# Patient Record
Sex: Female | Born: 1975
Health system: Southern US, Community
[De-identification: ages and names within clinical notes are randomized; demographics above are authoritative.]

## PROBLEM LIST (undated history)

## (undated) DIAGNOSIS — R51 Headache: Secondary | ICD-10-CM

## (undated) DIAGNOSIS — I10 Essential (primary) hypertension: Secondary | ICD-10-CM

## (undated) DIAGNOSIS — E079 Disorder of thyroid, unspecified: Secondary | ICD-10-CM

## (undated) DIAGNOSIS — Z8619 Personal history of other infectious and parasitic diseases: Secondary | ICD-10-CM

## (undated) DIAGNOSIS — J189 Pneumonia, unspecified organism: Secondary | ICD-10-CM

## (undated) DIAGNOSIS — R519 Headache, unspecified: Secondary | ICD-10-CM

## (undated) DIAGNOSIS — R0600 Dyspnea, unspecified: Secondary | ICD-10-CM

## (undated) DIAGNOSIS — F419 Anxiety disorder, unspecified: Secondary | ICD-10-CM

## (undated) DIAGNOSIS — S134XXA Sprain of ligaments of cervical spine, initial encounter: Secondary | ICD-10-CM

## (undated) DIAGNOSIS — E039 Hypothyroidism, unspecified: Secondary | ICD-10-CM

## (undated) DIAGNOSIS — E0500 Thyrotoxicosis with diffuse goiter without thyrotoxic crisis or storm: Secondary | ICD-10-CM

## (undated) HISTORY — DX: Personal history of other infectious and parasitic diseases: Z86.19

## (undated) HISTORY — PX: REPAIR EXTENSOR TENDON HAND: SUR1171

## (undated) HISTORY — PX: APPENDECTOMY: SHX54

## (undated) HISTORY — DX: Anxiety disorder, unspecified: F41.9

---
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
## 2014-04-14 ENCOUNTER — Ambulatory Visit (INDEPENDENT_AMBULATORY_CARE_PROVIDER_SITE_OTHER): Payer: BLUE CROSS/BLUE SHIELD | Admitting: Physician Assistant

## 2014-04-14 ENCOUNTER — Encounter: Payer: Self-pay | Admitting: Physician Assistant

## 2014-04-14 VITALS — BP 124/86 | HR 86 | Temp 99.3°F | Resp 14 | Ht 61.5 in | Wt 137.2 lb

## 2014-04-14 DIAGNOSIS — F418 Other specified anxiety disorders: Secondary | ICD-10-CM | POA: Diagnosis not present

## 2014-04-14 DIAGNOSIS — F32A Depression, unspecified: Secondary | ICD-10-CM

## 2014-04-14 DIAGNOSIS — F419 Anxiety disorder, unspecified: Principal | ICD-10-CM

## 2014-04-14 DIAGNOSIS — F329 Major depressive disorder, single episode, unspecified: Secondary | ICD-10-CM | POA: Insufficient documentation

## 2014-04-14 MED ORDER — FLUOXETINE HCL 20 MG PO CAPS: 40.0000 mg | ORAL_CAPSULE | Freq: Every day | ORAL | Status: DC

## 2014-04-14 NOTE — Assessment & Plan Note (Signed)
Stable.  Will continue 40 mg Prozac in winter and wean down to 20 mg daily when symptoms are stabilizing after cold months.  Medications refilled.  Will follow-up Q 6 months.

## 2014-04-14 NOTE — Progress Notes (Signed)
Patient presents to clinic today to establish care.  Acute Concerns: No acute concerns today.  Is requesting medication refills.  Chronic Issues: Anxiety/Depression -- Endorses 4 year history.  Patient endorses symptoms are well-controlled with Fluoxetine daily.  Takes 40 mg during the winter due to some seasonal affective disorder.  Denies panic attacks.  Denies SI/HI. Is almost out of medication.  Health Maintenance: Dental -- Endorses up-to-date Vision -- Endorses up-to-date Immunizations -- Declines flu shot.  Unsure of last Tetanus.  Will bring in old records for review. PAP -- up-to-date  Past Medical History  Diagnosis Date  . Anxiety   . History of chicken pox     Past Surgical History  Procedure Laterality Date  . Appendectomy      Age 39  . Cesarean section  2009, 2011    No current outpatient prescriptions on file prior to visit.   No current facility-administered medications on file prior to visit.    No Known Allergies  Family History  Problem Relation Age of Onset  . Hypertension Father     Living  . Stroke Father   . Arthritis/Rheumatoid Mother     Living  . Arthritis/Rheumatoid Maternal Uncle   . Dementia Paternal Grandfather   . Dementia Paternal Grandmother   . Healthy Maternal Grandmother   . Healthy Sister     x1  . Healthy Son     x1  . Healthy Daughter     x1    History   Social History  . Marital Status: Married    Spouse Name: N/A    Number of Children: N/A  . Years of Education: N/A   Occupational History  . Not on file.   Social History Main Topics  . Smoking status: Former Smoker    Start date: 03/12/2014  . Smokeless tobacco: Never Used  . Alcohol Use: 0.0 oz/week    0 Not specified per week     Comment: rare  . Drug Use: No  . Sexual Activity:    Partners: Male     Comment: husband   Other Topics Concern  . Not on file   Social History Narrative  . No narrative on file   ROS Pertinent ROS are listed in  the HPI.   BP 124/86 mmHg  Pulse 86  Temp(Src) 99.3 F (37.4 C) (Oral)  Resp 14  Ht 5' 1.5" (1.562 m)  Wt 137 lb 4 oz (62.256 kg)  BMI 25.52 kg/m2  SpO2 100%  LMP 04/10/2014  Physical Exam  Constitutional: She is oriented to person, place, and time and well-developed, well-nourished, and in no distress.  HENT:  Head: Normocephalic and atraumatic.  Right Ear: Tympanic membrane, external ear and ear canal normal.  Left Ear: Tympanic membrane, external ear and ear canal normal.  Nose: Nose normal. No mucosal edema.  Mouth/Throat: Uvula is midline, oropharynx is clear and moist and mucous membranes are normal. No oropharyngeal exudate or posterior oropharyngeal erythema.  Eyes: Conjunctivae are normal. Pupils are equal, round, and reactive to light.  Neck: Neck supple. No thyromegaly present.  Cardiovascular: Normal rate, regular rhythm, normal heart sounds and intact distal pulses.   Pulmonary/Chest: Effort normal and breath sounds normal. No respiratory distress. She has no wheezes. She has no rales.  Abdominal: Soft. Bowel sounds are normal. She exhibits no distension and no mass. There is no tenderness. There is no rebound and no guarding.  Lymphadenopathy:    She has no cervical adenopathy.  Neurological:  She is alert and oriented to person, place, and time. No cranial nerve deficit.  Skin: Skin is warm and dry. No rash noted.  Psychiatric: Affect normal.  Vitals reviewed.  Assessment/Plan: Anxiety and depression Stable.  Will continue 40 mg Prozac in winter and wean down to 20 mg daily when symptoms are stabilizing after cold months.  Medications refilled.  Will follow-up Q 6 months.

## 2014-04-14 NOTE — Progress Notes (Signed)
Pre visit review using our clinic review tool, if applicable. No additional management support is needed unless otherwise documented below in the visit note/SLS  

## 2014-04-14 NOTE — Patient Instructions (Signed)
Please continue medications as directed.  Stay active!  I want to see you every 6 months for your anxiety/depression.  Sooner if you need me.  Welcome to [facility]!  Preventive Care for Adults A healthy lifestyle and preventive care can promote health and wellness. Preventive health guidelines for women include the following key practices.  A routine yearly physical is a good way to check with your health care provider about your health and preventive screening. It is a chance to share any concerns and updates on your health and to receive a thorough exam.  Visit your dentist for a routine exam and preventive care every 6 months. Brush your teeth twice a day and floss once a day. Good oral hygiene prevents tooth decay and gum disease.  The frequency of eye exams is based on your age, health, family medical history, use of contact lenses, and other factors. Follow your health care provider's recommendations for frequency of eye exams.  Eat a healthy diet. Foods like vegetables, fruits, whole grains, low-fat dairy products, and lean protein foods contain the nutrients you need without too many calories. Decrease your intake of foods high in solid fats, added sugars, and salt. Eat the right amount of calories for you.Get information about a proper diet from your health care provider, if necessary.  Regular physical exercise is one of the most important things you can do for your health. Most adults should get at least 150 minutes of moderate-intensity exercise (any activity that increases your heart rate and causes you to sweat) each week. In addition, most adults need muscle-strengthening exercises on 2 or more days a week.  Maintain a healthy weight. The body mass index (BMI) is a screening tool to identify possible weight problems. It provides an estimate of body fat based on height and weight. Your health care provider can find your BMI and can help you achieve or maintain a healthy weight.For  adults 20 years and older:  A BMI below 18.5 is considered underweight.  A BMI of 18.5 to 24.9 is normal.  A BMI of 25 to 29.9 is considered overweight.  A BMI of 30 and above is considered obese.  Maintain normal blood lipids and cholesterol levels by exercising and minimizing your intake of saturated fat. Eat a balanced diet with plenty of fruit and vegetables. Blood tests for lipids and cholesterol should begin at age 46 and be repeated every 5 years. If your lipid or cholesterol levels are high, you are over 50, or you are at high risk for heart disease, you may need your cholesterol levels checked more frequently.Ongoing high lipid and cholesterol levels should be treated with medicines if diet and exercise are not working.  If you smoke, find out from your health care provider how to quit. If you do not use tobacco, do not start.  Lung cancer screening is recommended for adults aged 25-80 years who are at high risk for developing lung cancer because of a history of smoking. A yearly low-dose CT scan of the lungs is recommended for people who have at least a 30-pack-year history of smoking and are a current smoker or have quit within the past 15 years. A pack year of smoking is smoking an average of 1 pack of cigarettes a day for 1 year (for example: 1 pack a day for 30 years or 2 packs a day for 15 years). Yearly screening should continue until the smoker has stopped smoking for at least 15 years. Yearly screening should  be stopped for people who develop a health problem that would prevent them from having lung cancer treatment.  If you are pregnant, do not drink alcohol. If you are breastfeeding, be very cautious about drinking alcohol. If you are not pregnant and choose to drink alcohol, do not have more than 1 drink per day. One drink is considered to be 12 ounces (355 mL) of beer, 5 ounces (148 mL) of wine, or 1.5 ounces (44 mL) of liquor.  Avoid use of street drugs. Do not share needles  with anyone. Ask for help if you need support or instructions about stopping the use of drugs.  High blood pressure causes heart disease and increases the risk of stroke. Your blood pressure should be checked at least every 1 to 2 years. Ongoing high blood pressure should be treated with medicines if weight loss and exercise do not work.  If you are 58-63 years old, ask your health care provider if you should take aspirin to prevent strokes.  Diabetes screening involves taking a blood sample to check your fasting blood sugar level. This should be done once every 3 years, after age 62, if you are within normal weight and without risk factors for diabetes. Testing should be considered at a younger age or be carried out more frequently if you are overweight and have at least 1 risk factor for diabetes.  Breast cancer screening is essential preventive care for women. You should practice "breast self-awareness." This means understanding the normal appearance and feel of your breasts and may include breast self-examination. Any changes detected, no matter how small, should be reported to a health care provider. Women in their 32s and 30s should have a clinical breast exam (CBE) by a health care provider as part of a regular health exam every 1 to 3 years. After age 65, women should have a CBE every year. Starting at age 60, women should consider having a mammogram (breast X-ray test) every year. Women who have a family history of breast cancer should talk to their health care provider about genetic screening. Women at a high risk of breast cancer should talk to their health care providers about having an MRI and a mammogram every year.  Breast cancer gene (BRCA)-related cancer risk assessment is recommended for women who have family members with BRCA-related cancers. BRCA-related cancers include breast, ovarian, tubal, and peritoneal cancers. Having family members with these cancers may be associated with an  increased risk for harmful changes (mutations) in the breast cancer genes BRCA1 and BRCA2. Results of the assessment will determine the need for genetic counseling and BRCA1 and BRCA2 testing.  Routine pelvic exams to screen for cancer are no longer recommended for nonpregnant women who are considered low risk for cancer of the pelvic organs (ovaries, uterus, and vagina) and who do not have symptoms. Ask your health care provider if a screening pelvic exam is right for you.  If you have had past treatment for cervical cancer or a condition that could lead to cancer, you need Pap tests and screening for cancer for at least 20 years after your treatment. If Pap tests have been discontinued, your risk factors (such as having a new sexual partner) need to be reassessed to determine if screening should be resumed. Some women have medical problems that increase the chance of getting cervical cancer. In these cases, your health care provider may recommend more frequent screening and Pap tests.  The HPV test is an additional test that may  be used for cervical cancer screening. The HPV test looks for the virus that can cause the cell changes on the cervix. The cells collected during the Pap test can be tested for HPV. The HPV test could be used to screen women aged 29 years and older, and should be used in women of any age who have unclear Pap test results. After the age of 49, women should have HPV testing at the same frequency as a Pap test.  Colorectal cancer can be detected and often prevented. Most routine colorectal cancer screening begins at the age of 11 years and continues through age 58 years. However, your health care provider may recommend screening at an earlier age if you have risk factors for colon cancer. On a yearly basis, your health care provider may provide home test kits to check for hidden blood in the stool. Use of a small camera at the end of a tube, to directly examine the colon  (sigmoidoscopy or colonoscopy), can detect the earliest forms of colorectal cancer. Talk to your health care provider about this at age 45, when routine screening begins. Direct exam of the colon should be repeated every 5-10 years through age 36 years, unless early forms of pre-cancerous polyps or small growths are found.  People who are at an increased risk for hepatitis B should be screened for this virus. You are considered at high risk for hepatitis B if:  You were born in a country where hepatitis B occurs often. Talk with your health care provider about which countries are considered high risk.  Your parents were born in a high-risk country and you have not received a shot to protect against hepatitis B (hepatitis B vaccine).  You have HIV or AIDS.  You use needles to inject street drugs.  You live with, or have sex with, someone who has hepatitis B.  You get hemodialysis treatment.  You take certain medicines for conditions like cancer, organ transplantation, and autoimmune conditions.  Hepatitis C blood testing is recommended for all people born from [birth year] through 1965 and any individual with known risks for hepatitis C.  Practice safe sex. Use condoms and avoid high-risk sexual practices to reduce the spread of sexually transmitted infections (STIs). STIs include gonorrhea, chlamydia, syphilis, trichomonas, herpes, HPV, and human immunodeficiency virus (HIV). Herpes, HIV, and HPV are viral illnesses that have no cure. They can result in disability, cancer, and death.  You should be screened for sexually transmitted illnesses (STIs) including gonorrhea and chlamydia if:  You are sexually active and are younger than 24 years.  You are older than 24 years and your health care provider tells you that you are at risk for this type of infection.  Your sexual activity has changed since you were last screened and you are at an increased risk for chlamydia or gonorrhea. Ask your health  care provider if you are at risk.  If you are at risk of being infected with HIV, it is recommended that you take a prescription medicine daily to prevent HIV infection. This is called preexposure prophylaxis (PrEP). You are considered at risk if:  You are a heterosexual woman, are sexually active, and are at increased risk for HIV infection.  You take drugs by injection.  You are sexually active with a partner who has HIV.  Talk with your health care provider about whether you are at high risk of being infected with HIV. If you choose to begin PrEP, you should first be tested  for HIV. You should then be tested every 3 months for as long as you are taking PrEP.  Osteoporosis is a disease in which the bones lose minerals and strength with aging. This can result in serious bone fractures or breaks. The risk of osteoporosis can be identified using a bone density scan. Women ages 34 years and over and women at risk for fractures or osteoporosis should discuss screening with their health care providers. Ask your health care provider whether you should take a calcium supplement or vitamin D to reduce the rate of osteoporosis.  Menopause can be associated with physical symptoms and risks. Hormone replacement therapy is available to decrease symptoms and risks. You should talk to your health care provider about whether hormone replacement therapy is right for you.  Use sunscreen. Apply sunscreen liberally and repeatedly throughout the day. You should seek shade when your shadow is shorter than you. Protect yourself by wearing long sleeves, pants, a wide-brimmed hat, and sunglasses year round, whenever you are outdoors.  Once a month, do a whole body skin exam, using a mirror to look at the skin on your back. Tell your health care provider of new moles, moles that have irregular borders, moles that are larger than a pencil eraser, or moles that have changed in shape or color.  Stay current with required  vaccines (immunizations).  Influenza vaccine. All adults should be immunized every year.  Tetanus, diphtheria, and acellular pertussis (Td, Tdap) vaccine. Pregnant women should receive 1 dose of Tdap vaccine during each pregnancy. The dose should be obtained regardless of the length of time since the last dose. Immunization is preferred during the 27th-36th week of gestation. An adult who has not previously received Tdap or who does not know her vaccine status should receive 1 dose of Tdap. This initial dose should be followed by tetanus and diphtheria toxoids (Td) booster doses every 10 years. Adults with an unknown or incomplete history of completing a 3-dose immunization series with Td-containing vaccines should begin or complete a primary immunization series including a Tdap dose. Adults should receive a Td booster every 10 years.  Varicella vaccine. An adult without evidence of immunity to varicella should receive 2 doses or a second dose if she has previously received 1 dose. Pregnant females who do not have evidence of immunity should receive the first dose after pregnancy. This first dose should be obtained before leaving the health care facility. The second dose should be obtained 4-8 weeks after the first dose.  Human papillomavirus (HPV) vaccine. Females aged 13-26 years who have not received the vaccine previously should obtain the 3-dose series. The vaccine is not recommended for use in pregnant females. However, pregnancy testing is not needed before receiving a dose. If a female is found to be pregnant after receiving a dose, no treatment is needed. In that case, the remaining doses should be delayed until after the pregnancy. Immunization is recommended for any person with an immunocompromised condition through the age of 23 years if she did not get any or all doses earlier. During the 3-dose series, the second dose should be obtained 4-8 weeks after the first dose. The third dose should be  obtained 24 weeks after the first dose and 16 weeks after the second dose.  Zoster vaccine. One dose is recommended for adults aged 48 years or older unless certain conditions are present.  Measles, mumps, and rubella (MMR) vaccine. Adults born before 65 generally are considered immune to measles and mumps.  Adults born in [birth year] or later should have 1 or more doses of MMR vaccine unless there is a contraindication to the vaccine or there is laboratory evidence of immunity to each of the three diseases. A routine second dose of MMR vaccine should be obtained at least 28 days after the first dose for students attending postsecondary schools, health care workers, or international travelers. People who received inactivated measles vaccine or an unknown type of measles vaccine during 1963-1967 should receive 2 doses of MMR vaccine. People who received inactivated mumps vaccine or an unknown type of mumps vaccine before 1979 and are at high risk for mumps infection should consider immunization with 2 doses of MMR vaccine. For females of childbearing age, rubella immunity should be determined. If there is no evidence of immunity, females who are not pregnant should be vaccinated. If there is no evidence of immunity, females who are pregnant should delay immunization until after pregnancy. Unvaccinated health care workers born before [birth year] who lack laboratory evidence of measles, mumps, or rubella immunity or laboratory confirmation of disease should consider measles and mumps immunization with 2 doses of MMR vaccine or rubella immunization with 1 dose of MMR vaccine.  Pneumococcal 13-valent conjugate (PCV13) vaccine. When indicated, a person who is uncertain of her immunization history and has no record of immunization should receive the PCV13 vaccine. An adult aged 76 years or older who has certain medical conditions and has not been previously immunized should receive 1 dose of PCV13 vaccine. This PCV13 should be  followed with a dose of pneumococcal polysaccharide (PPSV23) vaccine. The PPSV23 vaccine dose should be obtained at least 8 weeks after the dose of PCV13 vaccine. An adult aged 58 years or older who has certain medical conditions and previously received 1 or more doses of PPSV23 vaccine should receive 1 dose of PCV13. The PCV13 vaccine dose should be obtained 1 or more years after the last PPSV23 vaccine dose.  Pneumococcal polysaccharide (PPSV23) vaccine. When PCV13 is also indicated, PCV13 should be obtained first. All adults aged 66 years and older should be immunized. An adult younger than age 76 years who has certain medical conditions should be immunized. Any person who resides in a nursing home or long-term care facility should be immunized. An adult smoker should be immunized. People with an immunocompromised condition and certain other conditions should receive both PCV13 and PPSV23 vaccines. People with human immunodeficiency virus (HIV) infection should be immunized as soon as possible after diagnosis. Immunization during chemotherapy or radiation therapy should be avoided. Routine use of PPSV23 vaccine is not recommended for American Indians, [place] Natives, or people younger than 65 years unless there are medical conditions that require PPSV23 vaccine. When indicated, people who have unknown immunization and have no record of immunization should receive PPSV23 vaccine. One-time revaccination 5 years after the first dose of PPSV23 is recommended for people aged 19-64 years who have chronic kidney failure, nephrotic syndrome, asplenia, or immunocompromised conditions. People who received 1-2 doses of PPSV23 before age 60 years should receive another dose of PPSV23 vaccine at age 70 years or later if at least 5 years have passed since the previous dose. Doses of PPSV23 are not needed for people immunized with PPSV23 at or after age 41 years.  Meningococcal vaccine. Adults with asplenia or persistent  complement component deficiencies should receive 2 doses of quadrivalent meningococcal conjugate (MenACWY-D) vaccine. The doses should be obtained at least 2 months apart. Microbiologists working with certain meningococcal bacteria, [employer] recruits,  people at risk during an outbreak, and people who travel to or live in countries with a high rate of meningitis should be immunized. A first-year college student up through age 63 years who is living in a residence hall should receive a dose if she did not receive a dose on or after her 16th birthday. Adults who have certain high-risk conditions should receive one or more doses of vaccine.  Hepatitis A vaccine. Adults who wish to be protected from this disease, have certain high-risk conditions, work with hepatitis A-infected animals, work in hepatitis A research labs, or travel to or work in countries with a high rate of hepatitis A should be immunized. Adults who were previously unvaccinated and who anticipate close contact with an international adoptee during the first 60 days after arrival in the [place] from a country with a high rate of hepatitis A should be immunized.  Hepatitis B vaccine. Adults who wish to be protected from this disease, have certain high-risk conditions, may be exposed to blood or other infectious body fluids, are household contacts or sex partners of hepatitis B positive people, are clients or workers in certain care facilities, or travel to or work in countries with a high rate of hepatitis B should be immunized.  Haemophilus influenzae type b (Hib) vaccine. A previously unvaccinated person with asplenia or sickle cell disease or having a scheduled splenectomy should receive 1 dose of Hib vaccine. Regardless of previous immunization, a recipient of a hematopoietic stem cell transplant should receive a 3-dose series 6-12 months after her successful transplant. Hib vaccine is not recommended for adults with HIV  infection. Preventive Services / Frequency Ages 107 to 70 years  Blood pressure check.** / Every 1 to 2 years.  Lipid and cholesterol check.** / Every 5 years beginning at age 22.  Clinical breast exam.** / Every 3 years for women in their 34s and 16s.  BRCA-related cancer risk assessment.** / For women who have family members with a BRCA-related cancer (breast, ovarian, tubal, or peritoneal cancers).  Pap test.** / Every 2 years from ages 39 through 52. Every 3 years starting at age 15 through age 30 or 28 with a history of 3 consecutive normal Pap tests.  HPV screening.** / Every 3 years from ages 62 through ages 44 to 48 with a history of 3 consecutive normal Pap tests.  Hepatitis C blood test.** / For any individual with known risks for hepatitis C.  Skin self-exam. / Monthly.  Influenza vaccine. / Every year.  Tetanus, diphtheria, and acellular pertussis (Tdap, Td) vaccine.** / Consult your health care provider. Pregnant women should receive 1 dose of Tdap vaccine during each pregnancy. 1 dose of Td every 10 years.  Varicella vaccine.** / Consult your health care provider. Pregnant females who do not have evidence of immunity should receive the first dose after pregnancy.  HPV vaccine. / 3 doses over 6 months, if 86 and younger. The vaccine is not recommended for use in pregnant females. However, pregnancy testing is not needed before receiving a dose.  Measles, mumps, rubella (MMR) vaccine.** / You need at least 1 dose of MMR if you were born in 1957 or later. You may also need a 2nd dose. For females of childbearing age, rubella immunity should be determined. If there is no evidence of immunity, females who are not pregnant should be vaccinated. If there is no evidence of immunity, females who are pregnant should delay immunization until after pregnancy.  Pneumococcal 13-valent conjugate (  PCV13) vaccine.** / Consult your health care provider.  Pneumococcal polysaccharide  (PPSV23) vaccine.** / 1 to 2 doses if you smoke cigarettes or if you have certain conditions.  Meningococcal vaccine.** / 1 dose if you are age 56 to 42 years and a Market researcher living in a residence hall, or have one of several medical conditions, you need to get vaccinated against meningococcal disease. You may also need additional booster doses.  Hepatitis A vaccine.** / Consult your health care provider.  Hepatitis B vaccine.** / Consult your health care provider.  Haemophilus influenzae type b (Hib) vaccine.** / Consult your health care provider. Ages 63 to 22 years  Blood pressure check.** / Every 1 to 2 years.  Lipid and cholesterol check.** / Every 5 years beginning at age 22 years.  Lung cancer screening. / Every year if you are aged 49-80 years and have a 30-pack-year history of smoking and currently smoke or have quit within the past 15 years. Yearly screening is stopped once you have quit smoking for at least 15 years or develop a health problem that would prevent you from having lung cancer treatment.  Clinical breast exam.** / Every year after age 55 years.  BRCA-related cancer risk assessment.** / For women who have family members with a BRCA-related cancer (breast, ovarian, tubal, or peritoneal cancers).  Mammogram.** / Every year beginning at age 29 years and continuing for as long as you are in good health. Consult with your health care provider.  Pap test.** / Every 3 years starting at age 60 years through age 65 or 43 years with a history of 3 consecutive normal Pap tests.  HPV screening.** / Every 3 years from ages 55 years through ages 68 to 79 years with a history of 3 consecutive normal Pap tests.  Fecal occult blood test (FOBT) of stool. / Every year beginning at age 63 years and continuing until age 33 years. You may not need to do this test if you get a colonoscopy every 10 years.  Flexible sigmoidoscopy or colonoscopy.** / Every 5 years for a  flexible sigmoidoscopy or every 10 years for a colonoscopy beginning at age 59 years and continuing until age 51 years.  Hepatitis C blood test.** / For all people born from [birth year] through 1965 and any individual with known risks for hepatitis C.  Skin self-exam. / Monthly.  Influenza vaccine. / Every year.  Tetanus, diphtheria, and acellular pertussis (Tdap/Td) vaccine.** / Consult your health care provider. Pregnant women should receive 1 dose of Tdap vaccine during each pregnancy. 1 dose of Td every 10 years.  Varicella vaccine.** / Consult your health care provider. Pregnant females who do not have evidence of immunity should receive the first dose after pregnancy.  Zoster vaccine.** / 1 dose for adults aged 57 years or older.  Measles, mumps, rubella (MMR) vaccine.** / You need at least 1 dose of MMR if you were born in 1955 or later. You may also need a 2nd dose. For females of childbearing age, rubella immunity should be determined. If there is no evidence of immunity, females who are not pregnant should be vaccinated. If there is no evidence of immunity, females who are pregnant should delay immunization until after pregnancy.  Pneumococcal 13-valent conjugate (PCV13) vaccine.** / Consult your health care provider.  Pneumococcal polysaccharide (PPSV23) vaccine.** / 1 to 2 doses if you smoke cigarettes or if you have certain conditions.  Meningococcal vaccine.** / Consult your health care provider.  Hepatitis A  vaccine.** / Consult your health care provider.  Hepatitis B vaccine.** / Consult your health care provider.  Haemophilus influenzae type b (Hib) vaccine.** / Consult your health care provider. Ages 54 years and over  Blood pressure check.** / Every 1 to 2 years.  Lipid and cholesterol check.** / Every 5 years beginning at age 31 years.  Lung cancer screening. / Every year if you are aged 19-80 years and have a 30-pack-year history of smoking and currently smoke or have  quit within the past 15 years. Yearly screening is stopped once you have quit smoking for at least 15 years or develop a health problem that would prevent you from having lung cancer treatment.  Clinical breast exam.** / Every year after age 86 years.  BRCA-related cancer risk assessment.** / For women who have family members with a BRCA-related cancer (breast, ovarian, tubal, or peritoneal cancers).  Mammogram.** / Every year beginning at age 38 years and continuing for as long as you are in good health. Consult with your health care provider.  Pap test.** / Every 3 years starting at age 26 years through age 20 or 62 years with 3 consecutive normal Pap tests. Testing can be stopped between 65 and 70 years with 3 consecutive normal Pap tests and no abnormal Pap or HPV tests in the past 10 years.  HPV screening.** / Every 3 years from ages 52 years through ages 70 or 78 years with a history of 3 consecutive normal Pap tests. Testing can be stopped between 65 and 70 years with 3 consecutive normal Pap tests and no abnormal Pap or HPV tests in the past 10 years.  Fecal occult blood test (FOBT) of stool. / Every year beginning at age 81 years and continuing until age 69 years. You may not need to do this test if you get a colonoscopy every 10 years.  Flexible sigmoidoscopy or colonoscopy.** / Every 5 years for a flexible sigmoidoscopy or every 10 years for a colonoscopy beginning at age 35 years and continuing until age 60 years.  Hepatitis C blood test.** / For all people born from [birth year] through 1963 and any individual with known risks for hepatitis C.  Osteoporosis screening.** / A one-time screening for women ages 60 years and over and women at risk for fractures or osteoporosis.  Skin self-exam. / Monthly.  Influenza vaccine. / Every year.  Tetanus, diphtheria, and acellular pertussis (Tdap/Td) vaccine.** / 1 dose of Td every 10 years.  Varicella vaccine.** / Consult your health care  provider.  Zoster vaccine.** / 1 dose for adults aged 53 years or older.  Pneumococcal 13-valent conjugate (PCV13) vaccine.** / Consult your health care provider.  Pneumococcal polysaccharide (PPSV23) vaccine.** / 1 dose for all adults aged 68 years and older.  Meningococcal vaccine.** / Consult your health care provider.  Hepatitis A vaccine.** / Consult your health care provider.  Hepatitis B vaccine.** / Consult your health care provider.  Haemophilus influenzae type b (Hib) vaccine.** / Consult your health care provider. ** Family history and personal history of risk and conditions may change your health care provider's recommendations. Document Released: 04/24/2001 Document Revised: 07/13/2013 Document Reviewed: 07/24/2010 [facility] Patient Information 2015 [place], [place]. This information is not intended to replace advice given to you by your health care provider. Make sure you discuss any questions you have with your health care provider.

## 2014-11-01 ENCOUNTER — Telehealth: Payer: Self-pay | Admitting: Physician Assistant

## 2014-11-01 NOTE — Telephone Encounter (Signed)
°  Relation to ZO:XWRU Call back number:[phone] Pharmacy:  Reason for call: pt would like for [facility] to give her a call states she has question regarding her meds that she is currently taken

## 2014-11-03 NOTE — Telephone Encounter (Signed)
Called and spoke with the pt and she stated that she feels the Fluoxetine  is no longer working.  Asked the pt how long she has been on the med and she stated 5 years.   She stated she feels the dose may need to be changed.   Informed the pt that from her last OV it was noted that she is to follow-up every 6 months for anxiety and depression.  Pt verbalized understanding and agreed.  Pt was scheduled an appt for follow-up with Selena Batten on Wed (11/10/14) @ 4:45pm).//AB/CMA

## 2014-11-10 ENCOUNTER — Ambulatory Visit: Payer: BLUE CROSS/BLUE SHIELD | Admitting: Physician Assistant

## 2014-11-10 ENCOUNTER — Ambulatory Visit (INDEPENDENT_AMBULATORY_CARE_PROVIDER_SITE_OTHER): Payer: BLUE CROSS/BLUE SHIELD | Admitting: Physician Assistant

## 2014-11-10 ENCOUNTER — Encounter: Payer: Self-pay | Admitting: Physician Assistant

## 2014-11-10 VITALS — BP 114/86 | HR 97 | Temp 98.8°F | Resp 16 | Ht 62.0 in | Wt 142.1 lb

## 2014-11-10 DIAGNOSIS — F329 Major depressive disorder, single episode, unspecified: Secondary | ICD-10-CM

## 2014-11-10 DIAGNOSIS — G44229 Chronic tension-type headache, not intractable: Secondary | ICD-10-CM

## 2014-11-10 DIAGNOSIS — F418 Other specified anxiety disorders: Secondary | ICD-10-CM | POA: Diagnosis not present

## 2014-11-10 DIAGNOSIS — F32A Depression, unspecified: Secondary | ICD-10-CM

## 2014-11-10 DIAGNOSIS — F419 Anxiety disorder, unspecified: Secondary | ICD-10-CM

## 2014-11-10 MED ORDER — HYDROCODONE-ACETAMINOPHEN 10-325 MG PO TABS: 1.0000 | ORAL_TABLET | Freq: Three times a day (TID) | ORAL | Status: DC | PRN

## 2014-11-10 MED ORDER — TRAMADOL HCL (ER BIPHASIC) 200 MG PO TB24: 1.0000 | ORAL_TABLET | Freq: Every day | ORAL | Status: DC | PRN

## 2014-11-10 MED ORDER — VENLAFAXINE HCL ER 37.5 MG PO CP24
37.5000 mg | ORAL_CAPSULE | Freq: Every day | ORAL | Status: DC
Start: 2014-11-10 — End: 2014-12-08

## 2014-11-10 NOTE — Progress Notes (Signed)
Pre visit review using our clinic review tool, if applicable. No additional management support is needed unless otherwise documented below in the visit note/SLS  
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
## 2014-11-10 NOTE — Patient Instructions (Signed)
Please start the Effexor daily instead of the Fluoxetine. Take as directed. Stay active as this is a good stress outlet. Follow-up in 4 weeks. Call me or return immediately if anything worsens.

## 2014-11-11 DIAGNOSIS — G44229 Chronic tension-type headache, not intractable: Secondary | ICD-10-CM | POA: Insufficient documentation

## 2014-11-11 NOTE — Assessment & Plan Note (Signed)
Will take over medications. Very infrequent use. Rx Tramadol ER 200 mg as needed. Norco for breakthrough pain. Follow-up 1 month.

## 2014-11-11 NOTE — Assessment & Plan Note (Signed)
Will stop Fluoxetine and begin Effexor XR at 37.5 mg daily. Will titrate quickly to 75 mg as tolerated. Follow-up in 1 month.

## 2014-12-03 ENCOUNTER — Other Ambulatory Visit: Payer: Self-pay | Admitting: Physician Assistant

## 2014-12-03 MED ORDER — MUPIROCIN CALCIUM 2 % EX CREA: 1.0000 "application " | TOPICAL_CREAM | Freq: Two times a day (BID) | CUTANEOUS | Status: DC

## 2014-12-08 ENCOUNTER — Encounter: Payer: Self-pay | Admitting: Physician Assistant

## 2014-12-08 ENCOUNTER — Encounter (INDEPENDENT_AMBULATORY_CARE_PROVIDER_SITE_OTHER): Payer: Self-pay

## 2014-12-08 ENCOUNTER — Ambulatory Visit (INDEPENDENT_AMBULATORY_CARE_PROVIDER_SITE_OTHER): Payer: BLUE CROSS/BLUE SHIELD | Admitting: Physician Assistant

## 2014-12-08 VITALS — BP 136/86 | HR 92 | Temp 99.0°F | Resp 16 | Ht 62.0 in | Wt 148.4 lb

## 2014-12-08 DIAGNOSIS — F329 Major depressive disorder, single episode, unspecified: Secondary | ICD-10-CM

## 2014-12-08 DIAGNOSIS — J208 Acute bronchitis due to other specified organisms: Secondary | ICD-10-CM | POA: Diagnosis not present

## 2014-12-08 DIAGNOSIS — F418 Other specified anxiety disorders: Secondary | ICD-10-CM | POA: Diagnosis not present

## 2014-12-08 DIAGNOSIS — F32A Depression, unspecified: Secondary | ICD-10-CM

## 2014-12-08 DIAGNOSIS — F419 Anxiety disorder, unspecified: Principal | ICD-10-CM

## 2014-12-08 MED ORDER — VENLAFAXINE HCL ER 37.5 MG PO CP24: 37.5000 mg | ORAL_CAPSULE | Freq: Every day | ORAL | Status: DC

## 2014-12-08 NOTE — Assessment & Plan Note (Signed)
Doing very well. Continue Effexor 37.5 mg daily. Follow-up 6 months as long as symptoms remain controlled.

## 2014-12-08 NOTE — Assessment & Plan Note (Signed)
Improving. Exam unremarkable. Continue hydration and rest. Start Deslym for cough. Probiotic. Saline nasal spray and humidifier in bedroom.

## 2014-12-08 NOTE — Progress Notes (Signed)
Patient presents to clinic today for follow-up of of anxiety and depression after starting the Effexor 37.5 daily. Endorses taking as directed with marked improvement in mood. Denies breakthrough symptoms. Denies SI/HI.  Patient endorses chest congestion, runny nose and non productive cough x 1 weeks. Denies fever, chills, chest pain or SOB. Denies recent travel. Endorses family has the same symptoms -- husband and both children. Symptoms have improved but cough is lingering. Is not taking anything for symptoms.  Past Medical History  Diagnosis Date  . Anxiety   . History of chicken pox     Current Outpatient Prescriptions on File Prior to Visit  Medication Sig Dispense Refill  . HYDROcodone-acetaminophen (NORCO) 10-325 MG per tablet Take 1 tablet by mouth every 8 (eight) hours as needed. 30 tablet 0  . mupirocin cream (BACTROBAN) 2 % Apply 1 application topically 2 (two) times daily. 15 g 0  . NON FORMULARY AMINO ACIDS POWDER MIX    . TraMADol HCl 200 MG TB24 Take 1 tablet by mouth daily as needed. 30 tablet 0   No current facility-administered medications on file prior to visit.    No Known Allergies  Family History  Problem Relation Age of Onset  . Hypertension Father     Living  . Stroke Father   . Arthritis/Rheumatoid Mother     Living  . Arthritis/Rheumatoid Maternal Uncle   . Dementia Paternal Grandfather   . Dementia Paternal Grandmother   . Healthy Maternal Grandmother   . Healthy Sister     x1  . Healthy Son     x1  . Healthy Daughter     x1    Social History   Social History  . Marital Status: Married    Spouse Name: N/A  . Number of Children: N/A  . Years of Education: N/A   Social History Main Topics  . Smoking status: Former Smoker    Start date: 03/12/2014  . Smokeless tobacco: Never Used  . Alcohol Use: 0.0 oz/week    0 Standard drinks or equivalent per week     Comment: rare  . Drug Use: No  . Sexual Activity:    Partners: Male   Comment: husband   Other Topics Concern  . None   Social History Narrative   Review of Systems - See HPI.  All other ROS are negative.  BP 136/86 mmHg  Pulse 92  Temp(Src) 99 F (37.2 C) (Oral)  Resp 16  Ht  (1.575 m)  Wt 148 lb 6 oz (67.302 kg)  BMI 27.13 kg/m2  SpO2 100%  LMP 11/28/2014  Physical Exam  Constitutional: She is oriented to person, place, and time and well-developed, well-nourished, and in no distress.  HENT:  Head: Normocephalic and atraumatic.  Right Ear: External ear normal.  Left Ear: External ear normal.  Nose: Nose normal.  Mouth/Throat: Oropharynx is clear and moist. No oropharyngeal exudate.  TM within normal limits bilaterally.  Eyes: Conjunctivae are normal.  Neck: Neck supple.  Cardiovascular: Normal rate, regular rhythm, normal heart sounds and intact distal pulses.   Pulmonary/Chest: Effort normal and breath sounds normal. No respiratory distress. She has no wheezes. She has no rales. She exhibits no tenderness.  Neurological: She is alert and oriented to person, place, and time.  Skin: Skin is warm and dry. No rash noted.  Psychiatric: Affect normal.  Vitals reviewed.   No results found for this or any previous visit (from the past 2160 hour(s)).  Assessment/Plan:  Viral bronchitis Improving. Exam unremarkable. Continue hydration and rest. Start Deslym for cough. Probiotic. Saline nasal spray and humidifier in bedroom.  Anxiety and depression Doing very well. Continue Effexor 37.5 mg daily. Follow-up 6 months as long as symptoms remain controlled.

## 2014-12-08 NOTE — Patient Instructions (Signed)
Please continue the Effexor as directed daily. I am glad you are doing better.  Please stay well hydrated and get plenty of rest. Use saline nasal spray to help with nasal congestion. Use Delsym for cough. Place a humidifier in the bedroom. Call if symptoms are not resolving.

## 2015-01-14 ENCOUNTER — Telehealth: Payer: Self-pay | Admitting: Physician Assistant

## 2015-01-14 MED ORDER — VENLAFAXINE HCL ER 75 MG PO CP24: 75.0000 mg | ORAL_CAPSULE | Freq: Every day | ORAL | Status: DC

## 2015-01-14 NOTE — Telephone Encounter (Signed)
Ok to send in Rx for 75 mg Effexor XR for her to take daily as she is tolerating lower dose. Have her follow-up with me in 2 months.

## 2015-01-14 NOTE — Telephone Encounter (Addendum)
Relation to pt: self Call back number: [phone] Pharmacy: [facility] [facility] - [place] [address] [phone]) [phone] (Fax)          Reason for call:  patient would like to increase her MG for venlafaxine XR (EFFEXOR-XR). Please advise

## 2015-01-14 NOTE — Telephone Encounter (Signed)
Rx request to pharmacy; pt informed/SLS  

## 2015-04-06 MED FILL — VENLAFAXINE HCL ER 75 MG CA: 75 | 30 days supply | Qty: 30 | Fill #2

## 2015-05-04 ENCOUNTER — Telehealth: Payer: Self-pay | Admitting: Physician Assistant

## 2015-05-04 MED ORDER — VENLAFAXINE HCL ER 75 MG PO CP24: 75.0000 mg | ORAL_CAPSULE | Freq: Every day | ORAL | Status: DC

## 2015-05-04 MED FILL — VENLAFAXINE HCL ER 37.5 MG: 37.5 | 30 days supply | Qty: 30 | Fill #2

## 2015-05-04 NOTE — Telephone Encounter (Signed)
Called and spoke with the pt and informed her of the note below.   Pt verbalized understanding and agreed.  Pt has an appt for (Tues-06/07/15 @ 9:15am) for follow-up.  Rx sent to the pharmacy by e-script.//AB/CMA

## 2015-05-04 NOTE — Telephone Encounter (Signed)
Caller name:Self  Can be reached: [phone] Pharmacy:  [facility] [place] [facility] - [place], [place] - [address] [phone] (Phone) [phone] (Fax)        Reason for call: Request refill on Effexor. States that she called the pharmacy to refill and was informed that she had to see her PCP prior to getting another refill. Patient was informed in Nov.16 to follow up with PCP within 2 months. Pls adv

## 2015-05-04 NOTE — Telephone Encounter (Signed)
Ok to give one-month supple. She needs a follow-up appointment in the next month

## 2015-05-12 MED FILL — VENLAFAXINE HCL ER 75 MG CA: 75 | 30 days supply | Qty: 30 | Fill #0

## 2015-05-17 ENCOUNTER — Ambulatory Visit: Payer: BLUE CROSS/BLUE SHIELD | Admitting: Physician Assistant

## 2015-05-24 ENCOUNTER — Telehealth: Payer: Self-pay | Admitting: Physician Assistant

## 2015-05-24 NOTE — Telephone Encounter (Signed)
Pt has NO VM, called pt to update info on her FLU SHOT

## 2015-06-07 ENCOUNTER — Ambulatory Visit: Payer: BLUE CROSS/BLUE SHIELD | Admitting: Physician Assistant

## 2015-06-08 ENCOUNTER — Ambulatory Visit (INDEPENDENT_AMBULATORY_CARE_PROVIDER_SITE_OTHER): Payer: BLUE CROSS/BLUE SHIELD | Admitting: Physician Assistant

## 2015-06-08 ENCOUNTER — Encounter: Payer: Self-pay | Admitting: Physician Assistant

## 2015-06-08 VITALS — BP 106/60 | HR 119 | Temp 98.3°F | Ht 62.0 in | Wt 140.2 lb

## 2015-06-08 DIAGNOSIS — F418 Other specified anxiety disorders: Secondary | ICD-10-CM

## 2015-06-08 DIAGNOSIS — F329 Major depressive disorder, single episode, unspecified: Secondary | ICD-10-CM

## 2015-06-08 DIAGNOSIS — F419 Anxiety disorder, unspecified: Principal | ICD-10-CM

## 2015-06-08 MED ORDER — VENLAFAXINE HCL ER 75 MG PO CP24: 75.0000 mg | ORAL_CAPSULE | Freq: Every day | ORAL | Status: DC

## 2015-06-08 NOTE — Progress Notes (Signed)
Pre visit review using our clinic review tool, if applicable. No additional management support is needed unless otherwise documented below in the visit note. 

## 2015-06-08 NOTE — Patient Instructions (Signed)
I am glad you are doing well. Please continue medications as directed.  LIMIT CAFFEINE !  Follow-up with me in 6 months for a physical. Return sooner as needed.

## 2015-06-08 NOTE — Progress Notes (Signed)
   Patient presents to clinic today for 1513-month follow-up of anxiety and depression. Is taking Effexor as directed with good relief of symptoms. Denies side effects of medication. Denies breakthrough symptoms. Has recently started exercising more with improved sleep and mood overall.   Past Medical History  Diagnosis Date  . Anxiety   . History of chicken pox     Current Outpatient Prescriptions on File Prior to Visit  Medication Sig Dispense Refill  . HYDROcodone-acetaminophen (NORCO) 10-325 MG per tablet Take 1 tablet by mouth every 8 (eight) hours as needed. 30 tablet 0  . NON FORMULARY AMINO ACIDS POWDER MIX    . TraMADol HCl 200 MG TB24 Take 1 tablet by mouth daily as needed. 30 tablet 0  . venlafaxine XR (EFFEXOR XR) 75 MG 24 hr capsule Take 1 capsule (75 mg total) by mouth daily with breakfast. 30 capsule 0   No current facility-administered medications on file prior to visit.    No Known Allergies  Family History  Problem Relation Age of Onset  . Hypertension Father     Living  . Stroke Father   . Arthritis/Rheumatoid Mother     Living  . Arthritis/Rheumatoid Maternal Uncle   . Dementia Paternal Grandfather   . Dementia Paternal Grandmother   . Healthy Maternal Grandmother   . Healthy Sister     x1  . Healthy Son     x1  . Healthy Daughter     x1    Social History   Social History  . Marital Status: Married    Spouse Name: N/A  . Number of Children: N/A  . Years of Education: N/A   Social History Main Topics  . Smoking status: Former Smoker    Start date: 03/12/2014  . Smokeless tobacco: Never Used  . Alcohol Use: 0.0 oz/week    0 Standard drinks or equivalent per week     Comment: rare  . Drug Use: No  . Sexual Activity:    Partners: Male     Comment: husband   Other Topics Concern  . None   Social History Narrative   Review of Systems - See HPI.  All other ROS are negative.  BP 106/60 mmHg  Pulse 119  Temp(Src) 98.3 F (36.8 C) (Oral)   Ht 5\' 2"  (1.575 m)  Wt 140 lb 3.2 oz (63.594 kg)  BMI 25.64 kg/m2  SpO2 98%  LMP 05/27/2015  Physical Exam  Constitutional: She is well-developed, well-nourished, and in no distress.  HENT:  Head: Normocephalic and atraumatic.  Eyes: Conjunctivae are normal.  Neck: Neck supple.  Cardiovascular: Regular rhythm, normal heart sounds and intact distal pulses.   Mildly tachycardic  Pulmonary/Chest: Effort normal and breath sounds normal. No respiratory distress. She has no wheezes. She has no rales. She exhibits no tenderness.  Skin: Skin is warm and dry. No rash noted.  Psychiatric: Affect normal.   Assessment/Plan: Anxiety and depression Is doing very well on current regimen. Denies breakthrough symptoms. Will continue the same. Of note, heart rate is mildly increased today which is new for patient. Denies symptoms. Has had 2-3 cups of coffee before visit today. Encouraged decreased caffeine intake. Will monitor at future visits.

## 2015-06-08 NOTE — Assessment & Plan Note (Signed)
Is doing very well on current regimen. Denies breakthrough symptoms. Will continue the same. Of note, heart rate is mildly increased today which is new for patient. Denies symptoms. Has had 2-3 cups of coffee before visit today. Encouraged decreased caffeine intake. Will monitor at future visits.

## 2015-06-20 MED FILL — VENLAFAXINE HCL ER 75 MG CA: 75 | 30 days supply | Qty: 30 | Fill #0

## 2015-07-19 MED FILL — VENLAFAXINE HCL ER 75 MG CA: 75 | 30 days supply | Qty: 30 | Fill #1

## 2015-08-15 MED FILL — VENLAFAXINE HCL ER 75 MG CA: 75 | 30 days supply | Qty: 30 | Fill #2

## 2015-09-12 MED FILL — VENLAFAXINE HCL ER 75 MG CA: 75 | 30 days supply | Qty: 30 | Fill #3

## 2015-10-14 MED FILL — VENLAFAXINE HCL ER 75 MG CA: 75 | 30 days supply | Qty: 30 | Fill #4

## 2015-11-11 MED FILL — VENLAFAXINE HCL ER 75 MG CA: 75 | 30 days supply | Qty: 30 | Fill #5

## 2015-11-18 ENCOUNTER — Ambulatory Visit (INDEPENDENT_AMBULATORY_CARE_PROVIDER_SITE_OTHER): Payer: BLUE CROSS/BLUE SHIELD | Admitting: Physician Assistant

## 2015-11-18 ENCOUNTER — Encounter: Payer: Self-pay | Admitting: Physician Assistant

## 2015-11-18 VITALS — BP 133/86 | HR 100 | Temp 98.2°F | Resp 16 | Ht 62.0 in | Wt 145.2 lb

## 2015-11-18 DIAGNOSIS — F32A Depression, unspecified: Secondary | ICD-10-CM

## 2015-11-18 DIAGNOSIS — F418 Other specified anxiety disorders: Secondary | ICD-10-CM

## 2015-11-18 DIAGNOSIS — F329 Major depressive disorder, single episode, unspecified: Secondary | ICD-10-CM

## 2015-11-18 DIAGNOSIS — F419 Anxiety disorder, unspecified: Principal | ICD-10-CM

## 2015-11-18 MED ORDER — VENLAFAXINE HCL ER 150 MG PO CP24
150.0000 mg | ORAL_CAPSULE | Freq: Every day | ORAL | 1 refills | Status: DC
Start: 2015-11-18 — End: 2015-12-07

## 2015-11-18 MED FILL — VENLAFAXINE HCL ER 150 MG C: 150 | 30 days supply | Qty: 30 | Fill #0

## 2015-11-18 NOTE — Patient Instructions (Signed)
Please start the new dose of medication. I have sent in a prescription. Please use the handout given to set up an appointment with Terri.  Follow-up with me in 3-4 weeks. If you notice any worsening symptoms on the medication, call or come see me immediately.  If you ever have thoughts of harming yourself or others, please call [phone]

## 2015-11-18 NOTE — Assessment & Plan Note (Addendum)
Handout given on counselors. She is going to schedule an appointment with Nicole Lozano. Discussed medication options. Patient has elected increase in Effexor. Will increase to 150 mg daily. Alarm signs/symptoms discussed that would prompt ER assessment. FU scheduled.

## 2015-11-18 NOTE — Progress Notes (Signed)
   Patient presents to clinic today for follow-up of anxiety and depression. Patient currently on Effexor XR 75 mg daily. Is taking as directed. Has previously done very well on this regimen without side effect. Has recently noted increased anxiety and depressed mood. Is feeling withdrawn from family. Denies SI/HI. Would like to see a Veterinary surgeoncounselor.   Past Medical History:  Diagnosis Date  . Anxiety   . History of chicken pox     Current Outpatient Prescriptions on File Prior to Visit  Medication Sig Dispense Refill  . HYDROcodone-acetaminophen (NORCO) 10-325 MG per tablet Take 1 tablet by mouth every 8 (eight) hours as needed. 30 tablet 0  . NON FORMULARY AMINO ACIDS POWDER MIX     No current facility-administered medications on file prior to visit.     No Known Allergies  Family History  Problem Relation Age of Onset  . Hypertension Father     Living  . Stroke Father   . Arthritis/Rheumatoid Mother     Living  . Arthritis/Rheumatoid Maternal Uncle   . Dementia Paternal Grandfather   . Dementia Paternal Grandmother   . Healthy Maternal Grandmother   . Healthy Sister     x1  . Healthy Son     x1  . Healthy Daughter     x1    Social History   Social History  . Marital status: Married    Spouse name: N/A  . Number of children: N/A  . Years of education: N/A   Social History Main Topics  . Smoking status: Former Smoker    Start date: 03/12/2014  . Smokeless tobacco: Never Used  . Alcohol use 0.0 oz/week     Comment: rare  . Drug use: No  . Sexual activity: Yes    Partners: Male     Comment: husband   Other Topics Concern  . None   Social History Narrative  . None   Review of Systems - See HPI.  All other ROS are negative.  BP 133/86 (BP Location: Right Arm, Patient Position: Sitting, Cuff Size: Normal)   Pulse 100   Temp 98.2 F (36.8 C) (Oral)   Resp 16   Ht 5\' 2"  (1.575 m)   Wt 145 lb 4 oz (65.9 kg)   LMP 10/24/2015   SpO2 99%   BMI 26.57 kg/m    Physical Exam  Constitutional: She is oriented to person, place, and time and well-developed, well-nourished, and in no distress.  HENT:  Head: Normocephalic and atraumatic.  Eyes: Conjunctivae are normal.  Neck: Neck supple.  Cardiovascular: Normal rate, regular rhythm, normal heart sounds and intact distal pulses.   Pulmonary/Chest: Effort normal and breath sounds normal. No respiratory distress. She has no wheezes. She has no rales. She exhibits no tenderness.  Neurological: She is alert and oriented to person, place, and time.  Skin: Skin is warm and dry. No rash noted.  Psychiatric: Her mood appears anxious.  Vitals reviewed.  Assessment/Plan: Anxiety and depression Handout given on counselors. She is going to schedule an appointment with Salomon Fickerri Bauert. Discussed medication options. Patient has elected increase in Effexor. Will increase to 150 mg daily. Alarm signs/symptoms discussed that would prompt ER assessment. FU scheduled.    Piedad ClimesMartin, Jaslene Marsteller Cody, PA-C

## 2015-11-30 ENCOUNTER — Telehealth: Payer: Self-pay | Admitting: Physician Assistant

## 2015-11-30 NOTE — Telephone Encounter (Signed)
Can be related to dose change of medication. I would have her reduce to previous dose for a couple of days and see if symptoms resolve. Other option is to switch directly to a different medication like Pristiq at a similar dose to prior dose of Effexor and see if that does her better. Also she was to stop the Tramadol -- has she been taking? The combination can cause serotonin syndrome which can cause some of the symptoms she is having.

## 2015-11-30 NOTE — Telephone Encounter (Signed)
Patient informed, understood & states that she has family coming tomorrow from out of country and she does not want to risk a complete change in medication right now, so she agreed to decrease back down to prior dosage of Effexor, and will let us know if she would like to try Pristiq; she also has not been taking the Tramadol, provider informed/SLS 09/20

## 2015-11-30 NOTE — Telephone Encounter (Signed)
Caller name: Relationship to patient: Self Can be reached: [phone]  Pharmacy:  Reason for call: Patient request call back to discuss possible side effects from the medication Effexor XR.

## 2015-11-30 NOTE — Telephone Encounter (Signed)
Spoke with patient who reports the first week of taking medication was "Awesome", then the following Monday, she reports waking up in the middle of the night with chills & sweats and vertigo/ dizziness ["everything spinning", she also reports she stared her menstrual on Sunday, but it is normal]; the following mornings she has been having positional vertigo, chills & sweats. Patient informed that information would be forwarded to provider for response on medication change and/or possibly needing appt, understood & agreed/SLS 09/20

## 2015-12-07 ENCOUNTER — Encounter: Payer: Self-pay | Admitting: Physician Assistant

## 2015-12-07 ENCOUNTER — Ambulatory Visit (INDEPENDENT_AMBULATORY_CARE_PROVIDER_SITE_OTHER): Payer: BLUE CROSS/BLUE SHIELD | Admitting: Physician Assistant

## 2015-12-07 VITALS — BP 124/78 | HR 125 | Temp 98.9°F | Resp 16 | Ht 62.0 in | Wt 145.2 lb

## 2015-12-07 DIAGNOSIS — F418 Other specified anxiety disorders: Secondary | ICD-10-CM

## 2015-12-07 DIAGNOSIS — F419 Anxiety disorder, unspecified: Principal | ICD-10-CM

## 2015-12-07 DIAGNOSIS — F329 Major depressive disorder, single episode, unspecified: Secondary | ICD-10-CM

## 2015-12-07 MED ORDER — VENLAFAXINE HCL ER 37.5 MG PO CP24: 37.5000 mg | ORAL_CAPSULE | Freq: Every day | ORAL | 1 refills | Status: DC

## 2015-12-07 MED ORDER — VENLAFAXINE HCL ER 75 MG PO CP24: 75.0000 mg | ORAL_CAPSULE | Freq: Every day | ORAL | 1 refills | Status: DC

## 2015-12-07 MED FILL — VENLAFAXINE HCL ER 75 MG CA: 75 | 30 days supply | Qty: 30 | Fill #0

## 2015-12-07 MED FILL — VENLAFAXINE HCL ER 37.5 MG: 37.5 | 30 days supply | Qty: 30 | Fill #0

## 2015-12-07 NOTE — Patient Instructions (Signed)
Stop the 150 mg dose of the Effexor XR. Please start the combo of 75 mg and 37.5 mg capsules. This will hopefully help heart rate but still treat symptoms.  Follow-up with me in 2 weeks after starting new dose.

## 2015-12-08 NOTE — Progress Notes (Signed)
   Patient presents to clinic today for follow-up of anxiety and depression after increase in Effexor XR to 150 mg daily. Endorses taking medication as directed. Initially noted mild dizziness with increase that has since resolved. Endorses good improvement in mood with medication. Denies depressed mood or anxiety with this regimen. Denies SI/HI.  Past Medical History:  Diagnosis Date  . Anxiety   . History of chicken pox     Current Outpatient Prescriptions on File Prior to Visit  Medication Sig Dispense Refill  . HYDROcodone-acetaminophen (NORCO) 10-325 MG per tablet Take 1 tablet by mouth every 8 (eight) hours as needed. 30 tablet 0  . NON FORMULARY AMINO ACIDS POWDER MIX     No current facility-administered medications on file prior to visit.     No Known Allergies  Family History  Problem Relation Age of Onset  . Hypertension Father     Living  . Stroke Father   . Arthritis/Rheumatoid Mother     Living  . Arthritis/Rheumatoid Maternal Uncle   . Dementia Paternal Grandfather   . Dementia Paternal Grandmother   . Healthy Maternal Grandmother   . Healthy Sister     x1  . Healthy Son     x1  . Healthy Daughter     x1    Social History   Social History  . Marital status: Married    Spouse name: N/A  . Number of children: N/A  . Years of education: N/A   Social History Main Topics  . Smoking status: Former Smoker    Start date: 03/12/2014  . Smokeless tobacco: Never Used  . Alcohol use 0.0 oz/week     Comment: rare  . Drug use: No  . Sexual activity: Yes    Partners: Male     Comment: husband   Other Topics Concern  . None   Social History Narrative  . None    Review of Systems - See HPI.  All other ROS are negative.  BP 124/78 (BP Location: Left Arm, Patient Position: Sitting, Cuff Size: Normal)   Pulse (!) 125   Temp 98.9 F (37.2 C) (Oral)   Resp 16   Ht 5\' 2"  (1.575 m)   Wt 145 lb 4 oz (65.9 kg)   LMP 10/24/2015   SpO2 99%   BMI 26.57 kg/m     Physical Exam  Constitutional: She is oriented to person, place, and time and well-developed, well-nourished, and in no distress.  HENT:  Head: Normocephalic and atraumatic.  Eyes: Conjunctivae are normal.  Neck: Neck supple.  Cardiovascular: Regular rhythm, normal heart sounds and intact distal pulses.   tachycardic  Pulmonary/Chest: Effort normal and breath sounds normal. No respiratory distress. She has no wheezes. She has no rales. She exhibits no tenderness.  Neurological: She is alert and oriented to person, place, and time.  Skin: Skin is warm and dry. No rash noted.  Vitals reviewed.  Assessment/Plan: 1. Anxiety and depression Improved mood with 150 mg dose but new onset tachycardia, feel secondary to the Effexor. Will decrease dose to 112.5 mg (37.5 mg tablet + 75 mg tablet) to see if mood remains stable but pulse improves. Supportive measures reviewed. FU scheduled.   Piedad ClimesMartin, Jamiya Nims Cody, PA-C

## 2015-12-27 ENCOUNTER — Encounter: Payer: Self-pay | Admitting: Physician Assistant

## 2015-12-27 ENCOUNTER — Ambulatory Visit (INDEPENDENT_AMBULATORY_CARE_PROVIDER_SITE_OTHER): Payer: BLUE CROSS/BLUE SHIELD | Admitting: Physician Assistant

## 2015-12-27 VITALS — BP 108/70 | HR 110 | Temp 98.9°F | Ht 62.0 in | Wt 147.5 lb

## 2015-12-27 DIAGNOSIS — R000 Tachycardia, unspecified: Secondary | ICD-10-CM

## 2015-12-27 LAB — CBC
HCT: 37.9 % (ref 36.0–46.0)
Hemoglobin: 12.8 g/dL (ref 12.0–15.0)
MCHC: 33.9 g/dL (ref 30.0–36.0)
MCV: 86.7 fl (ref 78.0–100.0)
Platelets: 272 10*3/uL (ref 150.0–400.0)
RBC: 4.37 Mil/uL (ref 3.87–5.11)
RDW: 12.2 % (ref 11.5–15.5)
WBC: 4.2 10*3/uL (ref 4.0–10.5)

## 2015-12-27 LAB — TSH: TSH: 0.03 u[IU]/mL — ABNORMAL LOW (ref 0.35–4.50)

## 2015-12-27 NOTE — Patient Instructions (Signed)
Please decrease the Effexor to 75 mg daily only. I am very hopeful that pulse will continue to come down to a normal level. Stay well hydrated and limit caffeine.  Stop by the lab today so I can check your blood count and thyroid to make sure nothing is contributing to your hart rate.  Follow-up will be scheduled when I call with lab results.

## 2015-12-27 NOTE — Progress Notes (Signed)
    Patient presents to clinic today for follow-up of anxiety/depression and tachycardia. At last visit Effexor dose was decreased as it was felt the Effexor was contributing to tachycardia. Patient is tolerating lower dose well. Endorses mood is stable. Declines deterioration in mood. Patient does endorse some improvement in heart rate, but feels heart racing with any activity. Denies tremor, fever, chills or malaise. Is hydrating well.   Past Medical History:  Diagnosis Date  . Anxiety   . History of chicken pox     Current Outpatient Prescriptions on File Prior to Visit  Medication Sig Dispense Refill  . HYDROcodone-acetaminophen (NORCO) 10-325 MG per tablet Take 1 tablet by mouth every 8 (eight) hours as needed. 30 tablet 0  . NON FORMULARY AMINO ACIDS POWDER MIX    . venlafaxine XR (EFFEXOR-XR) 37.5 MG 24 hr capsule Take 1 capsule (37.5 mg total) by mouth daily with breakfast. 30 capsule 1  . venlafaxine XR (EFFEXOR-XR) 75 MG 24 hr capsule Take 1 capsule (75 mg total) by mouth daily with breakfast. 30 capsule 1   No current facility-administered medications on file prior to visit.     No Known Allergies  Family History  Problem Relation Age of Onset  . Hypertension Father     Living  . Stroke Father   . Arthritis/Rheumatoid Mother     Living  . Arthritis/Rheumatoid Maternal Uncle   . Dementia Paternal Grandfather   . Dementia Paternal Grandmother   . Healthy Maternal Grandmother   . Healthy Sister     x1  . Healthy Son     x1  . Healthy Daughter     x1    Social History   Social History  . Marital status: Married    Spouse name: N/A  . Number of children: N/A  . Years of education: N/A   Social History Main Topics  . Smoking status: Former Smoker    Start date: 03/12/2014  . Smokeless tobacco: Never Used  . Alcohol use 0.0 oz/week     Comment: rare  . Drug use: No  . Sexual activity: Yes    Partners: Male     Comment: husband   Other Topics Concern  .  None   Social History Narrative  . None   Review of Systems - See HPI.  All other ROS are negative.  BP 108/70 (BP Location: Left Arm, Patient Position: Sitting, Cuff Size: Normal)   Pulse (!) 110   Temp 98.9 F (37.2 C) (Oral)   Ht 5\' 2"  (1.575 m)   Wt 147 lb 8 oz (66.9 kg)   LMP 12/24/2015   BMI 26.98 kg/m   Physical Exam  Constitutional: She is oriented to person, place, and time and well-developed, well-nourished, and in no distress.  HENT:  Head: Normocephalic and atraumatic.  Eyes: Conjunctivae are normal.  Neck: Neck supple. No thyromegaly present.  Cardiovascular: Regular rhythm, normal heart sounds and intact distal pulses.   No extrasystoles are present. Tachycardia present.   Pulmonary/Chest: Effort normal.  Neurological: She is alert and oriented to person, place, and time.  Skin: Skin is warm and dry. No rash noted.  Psychiatric: Affect normal.   Assessment/Plan: 1. Tachycardia Will continue to reduce Effexor back to 75 mg giving chance of this medication to raise HR. Will check CBC and thyroid studies today. Will alter regimen based on findings. - CBC - TSH   Piedad ClimesMartin, Billee Balcerzak Cody, PA-C

## 2015-12-30 ENCOUNTER — Other Ambulatory Visit (INDEPENDENT_AMBULATORY_CARE_PROVIDER_SITE_OTHER): Payer: BLUE CROSS/BLUE SHIELD

## 2015-12-30 ENCOUNTER — Telehealth: Payer: Self-pay | Admitting: *Deleted

## 2015-12-30 ENCOUNTER — Telehealth: Payer: Self-pay | Admitting: Physician Assistant

## 2015-12-30 DIAGNOSIS — R7989 Other specified abnormal findings of blood chemistry: Secondary | ICD-10-CM

## 2015-12-30 DIAGNOSIS — R000 Tachycardia, unspecified: Secondary | ICD-10-CM

## 2015-12-30 DIAGNOSIS — R946 Abnormal results of thyroid function studies: Secondary | ICD-10-CM

## 2015-12-30 LAB — T4, FREE: FREE T4: 4.02 ng/dL — AB (ref 0.60–1.60)

## 2015-12-30 NOTE — Telephone Encounter (Signed)
-----   Message from Waldon MerlWilliam C Martin, PA-C sent at 12/30/2015  4:35 PM EDT ----- T4 elevated as well. Please place urgent referral to Endocrinology for hyperthyroidism. Want her seen ASAP giving intermittent racing heart.  Make sure she hydrates well. Per conversation earlier (she is out of town) would like her assessed at Urgent care for possible thyroiditis giving complaints when she called in to office. Otherwise FU with me on Monday.

## 2015-12-30 NOTE — Telephone Encounter (Signed)
[facility] informed, understood & [facility]; Urgent referral to Endocrinology placed Danford Bad[Kristie is working on referral]/SLS 10/20

## 2015-12-30 NOTE — Telephone Encounter (Signed)
Pt called in to speak with CMA or provider. She says that she would like a call back whenever they are available   CB: [phone]

## 2015-12-30 NOTE — Telephone Encounter (Signed)
Spoke with patient about concerns. Discussed waiting on T4 test. Patient has noted a couple days of a lump on her neck that is tender to touch. States it is R sided. Very mild. No fever, chills, racing heart, etc. Discussed with her that without examination it is hard for me to tell. Ddx: tender thyroid nodule/thyroiditis versus lymph node versus abscess/cyst. Denies any drainage from lesion. Has taken Ibuprofen once with good relief of symptoms. Again denies other symptoms. She has been encouraged to go to an Urgent Care or ER facility for assessment since she is out of state at the moment. Will follow-up with patient via MyChart.

## 2016-01-02 ENCOUNTER — Ambulatory Visit (INDEPENDENT_AMBULATORY_CARE_PROVIDER_SITE_OTHER): Payer: BLUE CROSS/BLUE SHIELD | Admitting: Physician Assistant

## 2016-01-02 ENCOUNTER — Encounter: Payer: Self-pay | Admitting: Endocrinology

## 2016-01-02 ENCOUNTER — Encounter: Payer: Self-pay | Admitting: Physician Assistant

## 2016-01-02 ENCOUNTER — Ambulatory Visit (INDEPENDENT_AMBULATORY_CARE_PROVIDER_SITE_OTHER): Payer: BLUE CROSS/BLUE SHIELD | Admitting: Endocrinology

## 2016-01-02 VITALS — BP 124/70 | HR 125 | Temp 98.4°F | Resp 16 | Ht 62.0 in | Wt 147.4 lb

## 2016-01-02 DIAGNOSIS — E0789 Other specified disorders of thyroid: Secondary | ICD-10-CM

## 2016-01-02 DIAGNOSIS — E0590 Thyrotoxicosis, unspecified without thyrotoxic crisis or storm: Secondary | ICD-10-CM | POA: Diagnosis not present

## 2016-01-02 MED ORDER — METOPROLOL SUCCINATE ER 25 MG PO TB24
12.5000 mg | ORAL_TABLET | Freq: Every day | ORAL | 3 refills | Status: DC
Start: 2016-01-02 — End: 2016-01-04

## 2016-01-02 MED ORDER — METHIMAZOLE 10 MG PO TABS: 40.0000 mg | ORAL_TABLET | Freq: Two times a day (BID) | ORAL | 3 refills | Status: DC

## 2016-01-02 MED FILL — METOPROLOL SUCC ER 25 MG TA: 25 | 30 days supply | Qty: 15 | Fill #0

## 2016-01-02 MED FILL — methIMAzole 10 MG TABS: 10 | 30 days supply | Qty: 240 | Fill #0

## 2016-01-02 NOTE — Progress Notes (Signed)
Subjective:    Patient ID: Nicole Lozano, female    DOB: 1975/06/07, 40 y.o.   MRN: [id number]  HPI Pt reports few weeks of moderate tremor of the hands, and assoc palpitations.  She has no prior thyroid hx.  she has never had XRT to the anterior neck, or thyroid surgery.  He has never had thyroid imaging.  she does not consume kelp or any other prescribed or non-prescribed thyroid medication.  she has never been on amiodarone.   Past Medical History:  Diagnosis Date  . Anxiety   . History of chicken pox     Past Surgical History:  Procedure Laterality Date  . APPENDECTOMY     Age 40  . CESAREAN SECTION  2009, 2011    Social History   Social History  . Marital status: Married    Spouse name: N/A  . Number of children: N/A  . Years of education: N/A   Occupational History  . Not on file.   Social History Main Topics  . Smoking status: Former Smoker    Start date: 03/12/2014  . Smokeless tobacco: Never Used  . Alcohol use 0.0 oz/week     Comment: rare  . Drug use: No  . Sexual activity: Yes    Partners: Male     Comment: husband   Other Topics Concern  . Not on file   Social History Narrative  . No narrative on file    Current Outpatient Prescriptions on File Prior to Visit  Medication Sig Dispense Refill  . HYDROcodone-acetaminophen (NORCO) 10-325 MG per tablet Take 1 tablet by mouth every 8 (eight) hours as needed. 30 tablet 0  . venlafaxine XR (EFFEXOR-XR) 75 MG 24 hr capsule Take 1 capsule (75 mg total) by mouth daily with breakfast. 30 capsule 1   No current facility-administered medications on file prior to visit.     No Known Allergies  Family History  Problem Relation Age of Onset  . Hypertension Father     Living  . Stroke Father   . Arthritis/Rheumatoid Mother     Living  . Arthritis/Rheumatoid Maternal Uncle   . Dementia Paternal Grandfather   . Dementia Paternal Grandmother   . Healthy Maternal Grandmother   . Healthy Sister     x1  .  Healthy Son     x1  . Healthy Daughter     x1  . Thyroid disease Neg Hx     BP 110/70   Pulse (!) 122   Ht 5\' 2"  (1.575 m)   Wt 147 lb (66.7 kg)   LMP 12/24/2015   SpO2 98%   BMI 26.89 kg/m    Review of Systems denies weight loss, hoarseness, diplopia, easy bruising, and rhinorrhea. She has edema, increased appetite, anxiety, hair loss, headache, heat intolerance, doe, diarrhea, muscle weakness, increased urination, excessive diaphoresis, and decreased menstruation.      Objective:   Physical Exam VS: see vs page GEN: no distress HEAD: head: no deformity eyes: no periorbital swelling, no proptosis external nose and ears are normal mouth: no lesion seen NECK: 2-3 cm right thyroid nodule.  The thyroid is also 2-3 times normal size, in general.   CHEST WALL: no deformity LUNGS: clear to auscultation CV: tachycardic rate, but reg rhythm, no murmur ABD: abdomen is soft, nontender.  no hepatosplenomegaly.  not distended.  no hernia MUSCULOSKELETAL: muscle bulk and strength are grossly normal.  no obvious joint swelling.  gait is normal and steady EXTEMITIES: no  deformity.  no ulcer on the feet.  feet are of normal color and temp.  no edema PULSES: dorsalis pedis intact bilat.  no carotid bruit NEURO:  cn 2-12 grossly intact.   readily moves all 4's.  sensation is intact to touch on the feet.  Moderate tremor of the hands SKIN:  Normal texture and temperature.  No rash or suspicious lesion is visible.  Slightly diaphoretic.   NODES:  None palpable at the neck.  PSYCH: alert, well-oriented.  Does not appear anxious nor depressed.    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  Patient is advised the following: Patient Instructions    I have sent a prescription to your pharmacy, to slow the thyroid.  If ever you have fever while taking methimazole, stop it and call us, even if the reason is obvious, because of the risk of a rare side-effect.   We can reconsider the radioactive iodine if you want.   Please come back for a follow-up appointment in 2-3 weeks.

## 2016-01-02 NOTE — Progress Notes (Signed)
Patient presents to clinic today for follow-up of painful neck lesion. Patient just diagnosed with hyperthyroidism with decreases TSH (0.03) and elevated free t4 (4.02). Patient with mild tachycardia. Now notes increased hunger, fatigue and tremor. Denies swelling or jaundice. Denies confusion. Endorses lump is larger than first noticed. Is painful only with palpation. Denies difficulty swallowing or breathing. Denies heart burn or indigestion. Denies sore throat or URI symptoms.  Past Medical History:  Diagnosis Date  . Anxiety   . History of chicken pox     Current Outpatient Prescriptions on File Prior to Visit  Medication Sig Dispense Refill  . HYDROcodone-acetaminophen (NORCO) 10-325 MG per tablet Take 1 tablet by mouth every 8 (eight) hours as needed. 30 tablet 0  . venlafaxine XR (EFFEXOR-XR) 75 MG 24 hr capsule Take 1 capsule (75 mg total) by mouth daily with breakfast. 30 capsule 1   No current facility-administered medications on file prior to visit.     No Known Allergies  Family History  Problem Relation Age of Onset  . Hypertension Father     Living  . Stroke Father   . Arthritis/Rheumatoid Mother     Living  . Arthritis/Rheumatoid Maternal Uncle   . Dementia Paternal Grandfather   . Dementia Paternal Grandmother   . Healthy Maternal Grandmother   . Healthy Sister     x1  . Healthy Son     x1  . Healthy Daughter     x1    Social History   Social History  . Marital status: Married    Spouse name: N/A  . Number of children: N/A  . Years of education: N/A   Social History Main Topics  . Smoking status: Former Smoker    Start date: 03/12/2014  . Smokeless tobacco: Never Used  . Alcohol use 0.0 oz/week     Comment: rare  . Drug use: No  . Sexual activity: Yes    Partners: Male     Comment: husband   Other Topics Concern  . None   Social History Narrative  . None   Review of Systems - See HPI.  All other ROS are negative.  BP 124/70 (BP  Location: Left Arm, Patient Position: Sitting, Cuff Size: Normal)   Pulse (!) 125   Temp 98.4 F (36.9 C) (Oral)   Resp 16   Ht 5\' 2"  (1.575 m)   Wt 147 lb 6 oz (66.8 kg)   LMP 12/24/2015   SpO2 98%   BMI 26.96 kg/m   Physical Exam  Constitutional: She is oriented to person, place, and time and well-developed, well-nourished, and in no distress.  HENT:  Head: Normocephalic and atraumatic.  Right Ear: External ear normal.  Left Ear: External ear normal.  Nose: Nose normal.  Mouth/Throat: Oropharynx is clear and moist. No oropharyngeal exudate.  Eyes: Conjunctivae are normal. No scleral icterus.  Neck: Neck supple. Thyromegaly present.  R sided thyromegaly without definite nodule. Thyroid tender to palpation  Cardiovascular: Regular rhythm, normal heart sounds and intact distal pulses.   Pulses:      Dorsalis pedis pulses are 2+ on the right side, and 2+ on the left side.       Posterior tibial pulses are 2+ on the right side, and 2+ on the left side.  No peripheral edema noted  Pulmonary/Chest: Effort normal and breath sounds normal. No respiratory distress. She has no wheezes. She has no rales. She exhibits no tenderness.  Neurological: She is alert and  oriented to person, place, and time.  Skin: Skin is warm and dry. No rash noted.  Psychiatric: Affect normal.  Vitals reviewed.  Recent Results (from the past 2160 hour(s))  CBC     Status: None   Collection Time: 12/27/15 11:40 AM  Result Value Ref Range   WBC 4.2 4.0 - 10.5 K/uL   RBC 4.37 3.87 - 5.11 Mil/uL   Platelets 272.0 150.0 - 400.0 K/uL   Hemoglobin 12.8 12.0 - 15.0 g/dL   HCT 16.1 09.6 - 04.5 %   MCV 86.7 78.0 - 100.0 fl   MCHC 33.9 30.0 - 36.0 g/dL   RDW 40.9 81.1 - 91.4 %  TSH     Status: Abnormal   Collection Time: 12/27/15 11:40 AM  Result Value Ref Range   TSH 0.03 (L) 0.35 - 4.50 uIU/mL  T4, free     Status: Abnormal   Collection Time: 12/30/15  3:20 PM  Result Value Ref Range   Free T4 4.02 (H)  0.60 - 1.60 ng/dL    Comment: Specimens from patients who are undergoing biotin therapy and /or ingesting biotin supplements may contain high levels of biotin.  The higher biotin concentration in these specimens interferes with this Free T4 assay.  Specimens that contain high levels  of biotin may cause false high results for this Free T4 assay.  Please interpret results in light of the total clinical presentation of the patient.     Assessment/Plan: 1. Hyperthyroidism No sign of thyroid storm on examination. Discussed need for assessment by specialty giving progressive symptoms. These are progressing quickly. Patient scheduled to see Dr. Everardo All this afternoon. She has been given appointment details. Alarm signs/symptoms reviewed with patient that would prompt ER assessment.  2. Painful thyroid Endo appointment today for further assessment. OTC pain medications discussed.    Piedad Climes, PA-C

## 2016-01-02 NOTE — Patient Instructions (Signed)
Please go to your appointment with Dr. Everardo AllEllison.  [facility] [address] [place] [address] [phone]  Appointment at 3:45. Arrive at least 15 minutes early.  They will take excellent care of you.  Go home and relax until appointment. If you note any significant swelling of extremities, worsening anxiety/tremo, please call 911 or go to the ER.

## 2016-01-02 NOTE — Patient Instructions (Addendum)
I have sent a prescription to your pharmacy, to slow the thyroid.  If ever you have fever while taking methimazole, stop it and call us, even if the reason is obvious, because of the risk of a rare side-effect.   We can reconsider the radioactive iodine if you want.   Please come back for a follow-up appointment in 2-3 weeks.

## 2016-01-04 ENCOUNTER — Telehealth: Payer: Self-pay | Admitting: Endocrinology

## 2016-01-04 ENCOUNTER — Other Ambulatory Visit: Payer: Self-pay | Admitting: Endocrinology

## 2016-01-04 MED ORDER — METOPROLOL SUCCINATE ER 25 MG PO TB24: 25.0000 mg | ORAL_TABLET | Freq: Every day | ORAL | 3 refills | Status: DC

## 2016-01-04 NOTE — Telephone Encounter (Signed)
I called pt.  Increase toprol to 25 mg qd.

## 2016-01-04 NOTE — Telephone Encounter (Signed)
Pt stating the med for her pulse is not working it works for about 2-3 hours and then her pulse is back up to 120

## 2016-01-04 NOTE — Telephone Encounter (Signed)
See message and please advise, Thanks!  

## 2016-01-09 ENCOUNTER — Encounter: Payer: Self-pay | Admitting: Physician Assistant

## 2016-01-09 ENCOUNTER — Other Ambulatory Visit: Payer: Self-pay

## 2016-01-09 ENCOUNTER — Other Ambulatory Visit: Payer: Self-pay | Admitting: Physician Assistant

## 2016-01-09 MED ORDER — METOPROLOL SUCCINATE ER 25 MG PO TB24: 25.0000 mg | ORAL_TABLET | Freq: Every day | ORAL | 3 refills | Status: DC

## 2016-01-09 MED ORDER — VENLAFAXINE HCL ER 37.5 MG PO CP24: 37.5000 mg | ORAL_CAPSULE | Freq: Every day | ORAL | 1 refills | Status: DC

## 2016-01-10 MED FILL — VENLAFAXINE HCL ER 37.5 MG: 37.5 | 30 days supply | Qty: 30 | Fill #0

## 2016-01-14 ENCOUNTER — Encounter: Payer: Self-pay | Admitting: Physician Assistant

## 2016-01-14 ENCOUNTER — Encounter: Payer: Self-pay | Admitting: Endocrinology

## 2016-01-16 ENCOUNTER — Ambulatory Visit (INDEPENDENT_AMBULATORY_CARE_PROVIDER_SITE_OTHER): Payer: BLUE CROSS/BLUE SHIELD | Admitting: Endocrinology

## 2016-01-16 ENCOUNTER — Encounter: Payer: Self-pay | Admitting: Endocrinology

## 2016-01-16 VITALS — BP 130/78 | HR 111 | Ht 62.0 in | Wt 149.0 lb

## 2016-01-16 DIAGNOSIS — M2550 Pain in unspecified joint: Secondary | ICD-10-CM | POA: Diagnosis not present

## 2016-01-16 DIAGNOSIS — E0590 Thyrotoxicosis, unspecified without thyrotoxic crisis or storm: Secondary | ICD-10-CM

## 2016-01-16 LAB — T4, FREE: Free T4: 3.04 ng/dL — ABNORMAL HIGH (ref 0.60–1.60)

## 2016-01-16 LAB — SEDIMENTATION RATE: SED RATE: 17 mm/h (ref 0–20)

## 2016-01-16 LAB — TSH: TSH: 0 u[IU]/mL — AB (ref 0.35–4.50)

## 2016-01-16 MED ORDER — METOPROLOL SUCCINATE ER 50 MG PO TB24: 50.0000 mg | ORAL_TABLET | Freq: Every day | ORAL | 11 refills | Status: DC

## 2016-01-16 MED FILL — METOPROLOL SUCC ER 50 MG TA: 50 | 30 days supply | Qty: 30 | Fill #0

## 2016-01-16 NOTE — Progress Notes (Signed)
Subjective:    Patient ID: Nicole CrankerFrida Platt, female    DOB: 09-03-1975, 40 y.o.   MRN: [id number]  HPI Pt returns for f/u of hyperthyroidism (dx'ed 2017; she has never had thyroid imaging, but Grave's Dz is presumed, due to severity; however, she has a palpable nodule; tapazole is chosen as initial rx, due to severity).  She has moderate arthralgias, worst at the hips and knees, but no assoc fever.   Past Medical History:  Diagnosis Date  . Anxiety   . History of chicken pox     Past Surgical History:  Procedure Laterality Date  . APPENDECTOMY     Age 40  . CESAREAN SECTION  2009, 2011    Social History   Social History  . Marital status: Married    Spouse name: N/A  . Number of children: N/A  . Years of education: N/A   Occupational History  . Not on file.   Social History Main Topics  . Smoking status: Former Smoker    Start date: 03/12/2014  . Smokeless tobacco: Never Used  . Alcohol use 0.0 oz/week     Comment: rare  . Drug use: No  . Sexual activity: Yes    Partners: Male     Comment: husband   Other Topics Concern  . Not on file   Social History Narrative  . No narrative on file    Current Outpatient Prescriptions on File Prior to Visit  Medication Sig Dispense Refill  . methimazole (TAPAZOLE) 10 MG tablet Take 4 tablets (40 mg total) by mouth 2 (two) times daily. 240 tablet 3  . venlafaxine XR (EFFEXOR-XR) 37.5 MG 24 hr capsule Take 1 capsule (37.5 mg total) by mouth daily with breakfast. 30 capsule 1  . venlafaxine XR (EFFEXOR-XR) 75 MG 24 hr capsule Take 1 capsule (75 mg total) by mouth daily with breakfast. 30 capsule 1   No current facility-administered medications on file prior to visit.     No Known Allergies  Family History  Problem Relation Age of Onset  . Hypertension Father     Living  . Stroke Father   . Arthritis/Rheumatoid Mother     Living  . Arthritis/Rheumatoid Maternal Uncle   . Dementia Paternal Grandfather   . Dementia  Paternal Grandmother   . Healthy Maternal Grandmother   . Healthy Sister     x1  . Healthy Son     x1  . Healthy Daughter     x1  . Thyroid disease Neg Hx    BP 130/78   Pulse (!) 111   Ht 5\' 2"  (1.575 m)   Wt 149 lb (67.6 kg)   LMP 12/24/2015   SpO2 98%   BMI 27.25 kg/m   Review of Systems She still has palpitations and tremor.  She has a slight headache.     Objective:   Physical Exam VITAL SIGNS:  See vs page GENERAL: no distress NECK: 2-3 cm right thyroid nodule.  The thyroid is also 2-3 times normal size, in general.    Lab Results  Component Value Date   TSH 0.00 (L) 01/16/2016      Assessment & Plan:  Hyperthyroidism, slightly better.  Please continue the same medication. Arthralgias, new.  Check labs. Tachycardia: persistent despite rx. Patient is advised the following: Patient Instructions  blood tests are requested for you today.  We'll let you know about the results. If ever you have fever while taking methimazole, stop it and call us,  even if the reason is obvious, because of the risk of a rare side-effect. I have sent a prescription to your pharmacy, to double the metoprolol.   Please come back for a follow-up appointment in 1 month.

## 2016-01-16 NOTE — Patient Instructions (Signed)
blood tests are requested for you today.  We'll let you know about the results. If ever you have fever while taking methimazole, stop it and call us, even if the reason is obvious, because of the risk of a rare side-effect. I have sent a prescription to your pharmacy, to double the metoprolol.   Please come back for a follow-up appointment in 1 month.

## 2016-01-17 LAB — RHEUMATOID FACTOR: Rhuematoid fact SerPl-aCnc: <14 IU/mL (ref <14)

## 2016-01-18 ENCOUNTER — Encounter: Payer: Self-pay | Admitting: Physician Assistant

## 2016-01-18 ENCOUNTER — Other Ambulatory Visit: Payer: Self-pay | Admitting: Endocrinology

## 2016-01-18 ENCOUNTER — Encounter: Payer: Self-pay | Admitting: Endocrinology

## 2016-01-18 MED ORDER — PROPYLTHIOURACIL 50 MG PO TABS: 200.0000 mg | ORAL_TABLET | Freq: Three times a day (TID) | ORAL | 3 refills | Status: DC

## 2016-01-18 MED ORDER — TRIAMCINOLONE ACETONIDE 0.1 % EX CREA: 1.0000 "application " | TOPICAL_CREAM | Freq: Three times a day (TID) | CUTANEOUS | 1 refills | Status: DC

## 2016-01-18 MED FILL — TRIAMCINOLONE 0.1% CREAM: 0.1 | 30 days supply | Qty: 80 | Fill #0

## 2016-01-18 MED FILL — PROPYLTHIOURACIL 50 MG TAB: 50 | 30 days supply | Qty: 360 | Fill #0

## 2016-01-19 ENCOUNTER — Emergency Department (HOSPITAL_BASED_OUTPATIENT_CLINIC_OR_DEPARTMENT_OTHER)
Admission: EM | Admit: 2016-01-19 | Discharge: 2016-01-19 | Disposition: A | Discharge status: Home / Self Care | Payer: BLUE CROSS/BLUE SHIELD | Attending: Emergency Medicine | Admitting: Emergency Medicine

## 2016-01-19 ENCOUNTER — Telehealth: Payer: Self-pay | Admitting: Physician Assistant

## 2016-01-19 ENCOUNTER — Encounter (HOSPITAL_BASED_OUTPATIENT_CLINIC_OR_DEPARTMENT_OTHER): Payer: Self-pay

## 2016-01-19 DIAGNOSIS — X101XXA Contact with hot food, initial encounter: Secondary | ICD-10-CM | POA: Diagnosis not present

## 2016-01-19 DIAGNOSIS — Y92000 Kitchen of unspecified non-institutional (private) residence as  the place of occurrence of the external cause: Secondary | ICD-10-CM | POA: Diagnosis not present

## 2016-01-19 DIAGNOSIS — T2122XA Burn of second degree of abdominal wall, initial encounter: Secondary | ICD-10-CM | POA: Diagnosis not present

## 2016-01-19 DIAGNOSIS — T2102XA Burn of unspecified degree of abdominal wall, initial encounter: Secondary | ICD-10-CM | POA: Diagnosis present

## 2016-01-19 DIAGNOSIS — Y999 Unspecified external cause status: Secondary | ICD-10-CM | POA: Diagnosis not present

## 2016-01-19 DIAGNOSIS — Y93G3 Activity, cooking and baking: Secondary | ICD-10-CM | POA: Diagnosis not present

## 2016-01-19 DIAGNOSIS — Z87891 Personal history of nicotine dependence: Secondary | ICD-10-CM | POA: Diagnosis not present

## 2016-01-19 HISTORY — DX: Disorder of thyroid, unspecified: E07.9

## 2016-01-19 MED ORDER — BACITRACIN ZINC 500 UNIT/GM EX OINT
TOPICAL_OINTMENT | Freq: Two times a day (BID) | CUTANEOUS | Status: DC
  Administered 2016-01-19: 17:00:00 via TOPICAL
  Filled 2016-01-19: qty 28.35

## 2016-01-19 MED ORDER — OXYCODONE-ACETAMINOPHEN 5-325 MG PO TABS
1.0000 | ORAL_TABLET | Freq: Once | ORAL | Status: AC
  Administered 2016-01-19: 1 via ORAL
  Filled 2016-01-19: qty 1

## 2016-01-19 MED ORDER — HYDROCODONE-ACETAMINOPHEN 5-325 MG PO TABS: 1.0000 | ORAL_TABLET | Freq: Four times a day (QID) | ORAL | 0 refills | Status: DC | PRN

## 2016-01-19 MED FILL — HYDROCODON-APAP 5-325: 5-325 | 2 days supply | Qty: 10 | Fill #0

## 2016-01-19 NOTE — Telephone Encounter (Signed)
Per chart, pt went to the ER as advised.   
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
## 2016-01-19 NOTE — Discharge Instructions (Signed)
Apply the bacitracin ointment to the burn twice a day.

## 2016-01-20 ENCOUNTER — Encounter: Payer: Self-pay | Admitting: Physician Assistant

## 2016-01-20 MED ORDER — HYDROXYZINE HCL 10 MG PO TABS: 10.0000 mg | ORAL_TABLET | Freq: Three times a day (TID) | ORAL | 0 refills | Status: DC | PRN

## 2016-01-20 MED FILL — hydrOXYzine HCL 10 MG TABS: 10 | 10 days supply | Qty: 30 | Fill #0

## 2016-01-20 NOTE — Telephone Encounter (Signed)
Patient seen in ER last night and treated.

## 2016-01-24 ENCOUNTER — Encounter: Payer: Self-pay | Admitting: Endocrinology

## 2016-01-26 ENCOUNTER — Ambulatory Visit (INDEPENDENT_AMBULATORY_CARE_PROVIDER_SITE_OTHER): Payer: BLUE CROSS/BLUE SHIELD | Admitting: Physician Assistant

## 2016-01-26 ENCOUNTER — Encounter: Payer: Self-pay | Admitting: Physician Assistant

## 2016-01-26 VITALS — BP 120/72 | HR 94 | Temp 98.7°F | Resp 14 | Ht 62.0 in | Wt 155.0 lb

## 2016-01-26 DIAGNOSIS — L298 Other pruritus: Secondary | ICD-10-CM

## 2016-01-26 MED ORDER — HYDROXYZINE HCL 25 MG PO TABS: 25.0000 mg | ORAL_TABLET | Freq: Three times a day (TID) | ORAL | 0 refills | Status: DC | PRN

## 2016-01-26 MED FILL — hydrOXYzine HCL 25 MG TABS: 25 | 30 days supply | Qty: 90 | Fill #0

## 2016-01-26 NOTE — Progress Notes (Signed)
Pre visit review using our clinic review tool, if applicable. No additional management support is needed unless otherwise documented below in the visit note. 

## 2016-01-26 NOTE — Progress Notes (Signed)
Patient presents to clinic today c/o continued pruritus of palms and soles. Symptoms have been present for about 3 weeks, starting after initiation of methimazole for hyperthyroidism. Patient endorses Endocrinologist felt this symptom was related to medication side effect. As such, patient was told stop the methimazole and was started on PTU. Patient has taken for 1 week. Is tolerating well but itching has continued. Was started on Hydroxyzine by this provider until she good be seen. Notes resolution of itch while taking medication, but returns if she stops the medication. Denies rash or lesion at any point since symptom onset. Denies fever, chills, malaise. Notes tremors and heart rate are better with PTU.  Past Medical History:  Diagnosis Date  . Anxiety   . History of chicken pox   . Thyroid disease     Current Outpatient Prescriptions on File Prior to Visit  Medication Sig Dispense Refill  . HYDROcodone-acetaminophen (NORCO) 5-325 MG tablet Take 1 tablet by mouth every 6 (six) hours as needed for severe pain. 10 tablet 0  . metoprolol succinate (TOPROL-XL) 50 MG 24 hr tablet Take 1 tablet (50 mg total) by mouth daily. Take with or immediately following a meal. 30 tablet 11  . propylthiouracil (PTU) 50 MG tablet Take 4 tablets (200 mg total) by mouth 3 (three) times daily. 360 tablet 3  . venlafaxine XR (EFFEXOR-XR) 37.5 MG 24 hr capsule Take 1 capsule (37.5 mg total) by mouth daily with breakfast. 30 capsule 1  . venlafaxine XR (EFFEXOR-XR) 75 MG 24 hr capsule Take 1 capsule (75 mg total) by mouth daily with breakfast. 30 capsule 1   No current facility-administered medications on file prior to visit.     Allergies  Allergen Reactions  . Methimazole Itching    Family History  Problem Relation Age of Onset  . Hypertension Father     Living  . Stroke Father   . Arthritis/Rheumatoid Mother     Living  . Arthritis/Rheumatoid Maternal Uncle   . Dementia Paternal Grandfather   .  Dementia Paternal Grandmother   . Healthy Maternal Grandmother   . Healthy Sister     x1  . Healthy Son     x1  . Healthy Daughter     x1  . Thyroid disease Neg Hx     Social History   Social History  . Marital status: Married    Spouse name: N/A  . Number of children: N/A  . Years of education: N/A   Social History Main Topics  . Smoking status: Former Smoker    Start date: 03/12/2014  . Smokeless tobacco: Never Used  . Alcohol use 0.0 oz/week     Comment: occ  . Drug use: No  . Sexual activity: Not Asked   Other Topics Concern  . None   Social History Narrative  . None    Review of Systems - See HPI.  All other ROS are negative.  BP 120/72   Pulse 94   Temp 98.7 F (37.1 C) (Oral)   Resp 14   Ht 5\' 2"  (1.575 m)   Wt 155 lb (70.3 kg)   LMP 12/24/2015   SpO2 100%   BMI 28.35 kg/m   Physical Exam  Constitutional: She is oriented to person, place, and time and well-developed, well-nourished, and in no distress.  HENT:  Head: Normocephalic and atraumatic.  Eyes: Conjunctivae are normal.  Neck: Neck supple.  Cardiovascular: Normal rate, regular rhythm, normal heart sounds and intact distal pulses.  Pulmonary/Chest: Effort normal and breath sounds normal. No respiratory distress. She has no wheezes. She has no rales. She exhibits no tenderness.  Neurological: She is alert and oriented to person, place, and time.  Skin: Skin is warm and dry. No rash noted.  Psychiatric: Affect normal.  Vitals reviewed.   Recent Results (from the past 2160 hour(s))  CBC     Status: None   Collection Time: 12/27/15 11:40 AM  Result Value Ref Range   WBC 4.2 4.0 - 10.5 K/uL   RBC 4.37 3.87 - 5.11 Mil/uL   Platelets 272.0 150.0 - 400.0 K/uL   Hemoglobin 12.8 12.0 - 15.0 g/dL   HCT 09.837.9 11.936.0 - 14.746.0 %   MCV 86.7 78.0 - 100.0 fl   MCHC 33.9 30.0 - 36.0 g/dL   RDW 82.912.2 56.211.5 - 13.015.5 %  TSH     Status: Abnormal   Collection Time: 12/27/15 11:40 AM  Result Value Ref Range    TSH 0.03 (L) 0.35 - 4.50 uIU/mL  T4, free     Status: Abnormal   Collection Time: 12/30/15  3:20 PM  Result Value Ref Range   Free T4 4.02 (H) 0.60 - 1.60 ng/dL    Comment: Specimens from patients who are undergoing biotin therapy and /or ingesting biotin supplements may contain high levels of biotin.  The higher biotin concentration in these specimens interferes with this Free T4 assay.  Specimens that contain high levels  of biotin may cause false high results for this Free T4 assay.  Please interpret results in light of the total clinical presentation of the patient.    Rheumatoid Factor     Status: None   Collection Time: 01/16/16  3:45 PM  Result Value Ref Range   Rhuematoid fact SerPl-aCnc <14 <14 IU/mL  T4, free     Status: Abnormal   Collection Time: 01/16/16  3:45 PM  Result Value Ref Range   Free T4 3.04 (H) 0.60 - 1.60 ng/dL    Comment: Specimens from patients who are undergoing biotin therapy and /or ingesting biotin supplements may contain high levels of biotin.  The higher biotin concentration in these specimens interferes with this Free T4 assay.  Specimens that contain high levels  of biotin may cause false high results for this Free T4 assay.  Please interpret results in light of the total clinical presentation of the patient.    TSH     Status: Abnormal   Collection Time: 01/16/16  3:45 PM  Result Value Ref Range   TSH 0.00 (L) 0.35 - 4.50 uIU/mL  Sedimentation rate     Status: None   Collection Time: 01/16/16  3:45 PM  Result Value Ref Range   Sed Rate 17 0 - 20 mm/hr    Assessment/Plan: 1. Pruritus of palm No rash or lesion. Suspect secondary to hyperthyroidism, in process of treatment. Spoke with Dr. Romero BellingSean Ellison (patient's Endocrinologist) who agrees it is a sequela of her thyroid state. Will continue PTU to get some control over thyroid function and he plans on doing a radioactive iodine treatment. Will increase hydroxyzine to 25 mg dosage. Continue supportive  measures. Will monitor for any symptoms persistence despite RAI.  - hydrOXYzine (ATARAX/VISTARIL) 25 MG tablet; Take 1 tablet (25 mg total) by mouth 3 (three) times daily as needed.  Dispense: 90 tablet; Refill: 0   Piedad ClimesMartin, Lusia Greis [place], [place]PA-C

## 2016-01-26 NOTE — Patient Instructions (Signed)
Itching is related to your overactive thyroid. Your Endocrinologist is very convinced of this as am I. Continue the PTU as directed by him. Start the new dose of the Atarax as directed to control itch. Once we get thyroid under a little better control, your specialist plans on doing a radioactive iodine treatment to resolve all issues.   Let me know if the new regimen is not controlling itch. Cold compresses and sarna lotion will also be beneficial.

## 2016-02-01 ENCOUNTER — Encounter: Payer: Self-pay | Admitting: Physician Assistant

## 2016-02-01 ENCOUNTER — Other Ambulatory Visit: Payer: Self-pay | Admitting: Emergency Medicine

## 2016-02-01 MED ORDER — VENLAFAXINE HCL ER 75 MG PO CP24: 75.0000 mg | ORAL_CAPSULE | Freq: Every day | ORAL | 1 refills | Status: DC

## 2016-02-01 MED FILL — VENLAFAXINE HCL ER 37.5 MG: 37.5 | 30 days supply | Qty: 30 | Fill #1

## 2016-02-01 MED FILL — VENLAFAXINE HCL ER 75 MG CA: 75 | 30 days supply | Qty: 30 | Fill #0

## 2016-02-02 ENCOUNTER — Encounter: Payer: Self-pay | Admitting: Endocrinology

## 2016-02-02 ENCOUNTER — Encounter: Payer: Self-pay | Admitting: Physician Assistant

## 2016-02-06 NOTE — Telephone Encounter (Signed)
Advised patient per Nicole Lozano to cut her dosage of Metoprolol in half since her blood pressure was low. Patient states she was started on a low dose and her pulse remained elevated so the dosage was increased. Patient states she wants to wait until her appointment with Dr Everardo AllEllison for evaluation.

## 2016-02-06 NOTE — Telephone Encounter (Signed)
She needs an assessment in office early this week. I would have her go ahead and cut the Toprol XL in half. I am concerned it is lowering her BP too much which is causing the dizziness. Have her do this and come see us tomorrow. If there is any major recurrence of symptoms today, she needs to be seen at an Urgent Care or ER.

## 2016-02-08 ENCOUNTER — Encounter: Payer: Self-pay | Admitting: Endocrinology

## 2016-02-08 ENCOUNTER — Ambulatory Visit (INDEPENDENT_AMBULATORY_CARE_PROVIDER_SITE_OTHER): Payer: BLUE CROSS/BLUE SHIELD | Admitting: Endocrinology

## 2016-02-08 VITALS — BP 112/78 | HR 68 | Ht 62.0 in | Wt 156.0 lb

## 2016-02-08 DIAGNOSIS — E0590 Thyrotoxicosis, unspecified without thyrotoxic crisis or storm: Secondary | ICD-10-CM | POA: Diagnosis not present

## 2016-02-08 LAB — T4, FREE: FREE T4: 1.52 ng/dL (ref 0.60–1.60)

## 2016-02-08 LAB — TSH: TSH: 0.03 u[IU]/mL — AB (ref 0.35–4.50)

## 2016-02-08 MED ORDER — METOPROLOL SUCCINATE ER 25 MG PO TB24: 25.0000 mg | ORAL_TABLET | Freq: Every day | ORAL | 3 refills | Status: DC

## 2016-02-08 MED FILL — METOPROLOL SUCC ER 25 MG TA: 25 | 30 days supply | Qty: 30 | Fill #0

## 2016-02-08 NOTE — Patient Instructions (Signed)
please go off the methimazole, and: Reduce the metoprolol to 25 mg daily.  I have sent a prescription to your pharmacy. let's check a thyroid "scan" (a special, but easy and painless type of thyroid x ray).  It works like this: you go to the x-ray department of the hospital to swallow a pill, which contains a miniscule amount of radiation.  You will not notice any symptoms from this.  You will go back to the x-ray department the next day, to lie down in front of a camera.  The results of this will be sent to me.   Based on the results, i hope to order for you a treatment pill of radioactive iodine.  Although it is a larger amount of radiation, you will again notice no symptoms from this.  The pill is gone from your body in a few days (during which you should stay away from other people), but takes several months to work.  Therefore, please return here approximately 6-8 weeks after the treatment.  This treatment has been available for many years, and the only known side-effect is an underactive thyroid.  It is possible that i would eventually prescribe for you a thyroid hormone pill, which is very inexpensive.  You don't have to worry about side-effects of this thyroid hormone pill, because it is the same molecule your thyroid makes.

## 2016-02-08 NOTE — Progress Notes (Signed)
Subjective:    Patient ID: Gildardo CrankerFrida Garzon, female    DOB: 09/17/1975, 40 y.o.   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   Pt reports intermittent dizziness.  When she checks, BP is slightly low, and HR is slightly high.   Past Medical History:  Diagnosis Date  . Anxiety   . History of chicken pox   . Thyroid disease     Past Surgical History:  Procedure Laterality Date  . APPENDECTOMY     Age 40  . CESAREAN SECTION  2009, 2011    Social History   Social History  . Marital status: Married    Spouse name: N/A  . Number of children: N/A  . Years of education: N/A   Occupational History  . Not on file.   Social History Main Topics  . Smoking status: Former Smoker    Start date: 03/12/2014  . Smokeless tobacco: Never Used  . Alcohol use 0.0 oz/week     Comment: occ  . Drug use: No  . Sexual activity: Not on file   Other Topics Concern  . Not on file   Social History Narrative  . No narrative on file    Current Outpatient Prescriptions on File Prior to Visit  Medication Sig Dispense Refill  . HYDROcodone-acetaminophen (NORCO) 5-325 MG tablet Take 1 tablet by mouth every 6 (six) hours as needed for severe pain. 10 tablet 0  . hydrOXYzine (ATARAX/VISTARIL) 25 MG tablet Take 1 tablet (25 mg total) by mouth 3 (three) times daily as needed. (Patient taking differently: Take 25 mg by mouth. 1 tablet every other night.) 90 tablet 0  . metoprolol succinate (TOPROL-XL) 50 MG 24 hr tablet Take 1 tablet (50 mg total) by mouth daily. Take with or immediately following a meal. 30 tablet 11  . venlafaxine XR (EFFEXOR-XR) 75 MG 24 hr capsule Take 1 capsule (75 mg total) by mouth daily with breakfast. 90 capsule 1   No current facility-administered medications on file prior to visit.      Allergies  Allergen Reactions  . Methimazole Itching    Family History  Problem Relation Age of Onset  . Hypertension Father     Living  . Stroke Father   . Arthritis/Rheumatoid Mother     Living  . Arthritis/Rheumatoid Maternal Uncle   . Dementia Paternal Grandfather   . Dementia Paternal Grandmother   . Healthy Maternal Grandmother   . Healthy Sister     x1  . Healthy Son     x1  . Healthy Daughter     x1  . Thyroid disease Neg Hx     BP 112/78   Pulse 68   Ht 5\' 2"  (1.575 m)   Wt 156 lb (70.8 kg)   SpO2 97%   BMI 28.53 kg/m    Review of Systems Denies fever.      Objective:   Physical Exam  VITAL SIGNS:  See vs page.   GENERAL: no distress.  NECK: 2-3 cm right thyroid nodule.  The thyroid is also 2-3 times normal size, in general.     Lab Results  Component Value Date   TSH 0.03 (L) 02/08/2016      Assessment & Plan:  Hyperthyroidism, improved Dizziness, prob due to toprol.  Patient is  advised the following: Patient Instructions  please go off the methimazole, and: Reduce the metoprolol to 25 mg daily.  I have sent a prescription to your pharmacy. let's check a thyroid "scan" (a special, but easy and painless type of thyroid x ray).  It works like this: you go to the x-ray department of the hospital to swallow a pill, which contains a miniscule amount of radiation.  You will not notice any symptoms from this.  You will go back to the x-ray department the next day, to lie down in front of a camera.  The results of this will be sent to me.   Based on the results, i hope to order for you a treatment pill of radioactive iodine.  Although it is a larger amount of radiation, you will again notice no symptoms from this.  The pill is gone from your body in a few days (during which you should stay away from other people), but takes several months to work.  Therefore, please return here approximately 6-8 weeks after the treatment.  This treatment has been  available for many years, and the only known side-effect is an underactive thyroid.  It is possible that i would eventually prescribe for you a thyroid hormone pill, which is very inexpensive.  You don't have to worry about side-effects of this thyroid hormone pill, because it is the same molecule your thyroid makes.

## 2016-02-10 ENCOUNTER — Encounter: Payer: Self-pay | Admitting: Endocrinology

## 2016-02-13 ENCOUNTER — Telehealth: Payer: Self-pay | Admitting: *Deleted

## 2016-02-13 NOTE — Telephone Encounter (Signed)
Per request of Dr. Everardo AllEllison I called [facility] radiology scheduling, to see if Pt could come in for test late this week or early next, the scheduler Fernand ParkinsSusan N. Did not answer, I lvm asking her to return call.

## 2016-02-13 NOTE — Telephone Encounter (Signed)
Informed Pt that new test dates are Dec. 6th and 7th at 1:45pm at [facility]. Pt stated understanding, had no further questions.

## 2016-02-14 ENCOUNTER — Ambulatory Visit: Payer: BLUE CROSS/BLUE SHIELD | Admitting: Endocrinology

## 2016-02-15 ENCOUNTER — Encounter: Payer: Self-pay | Admitting: Endocrinology

## 2016-02-15 ENCOUNTER — Encounter (HOSPITAL_COMMUNITY)
Admission: RE | Admit: 2016-02-15 | Discharge: 2016-02-15 | Disposition: A | Discharge status: Home / Self Care | Payer: BLUE CROSS/BLUE SHIELD | Source: Ambulatory Visit | Attending: Endocrinology | Admitting: Endocrinology

## 2016-02-15 DIAGNOSIS — E0590 Thyrotoxicosis, unspecified without thyrotoxic crisis or storm: Secondary | ICD-10-CM

## 2016-02-15 MED ORDER — SODIUM IODIDE I 131 CAPSULE
10.2300 | Freq: Once | INTRAVENOUS | Status: AC | PRN
  Administered 2016-02-15: 10.23 via ORAL

## 2016-02-16 ENCOUNTER — Encounter (HOSPITAL_COMMUNITY)
Admission: RE | Admit: 2016-02-16 | Discharge: 2016-02-16 | Disposition: A | Discharge status: Home / Self Care | Payer: BLUE CROSS/BLUE SHIELD | Source: Ambulatory Visit | Attending: Endocrinology | Admitting: Endocrinology

## 2016-02-16 DIAGNOSIS — E0590 Thyrotoxicosis, unspecified without thyrotoxic crisis or storm: Secondary | ICD-10-CM | POA: Diagnosis not present

## 2016-02-16 MED ORDER — SODIUM PERTECHNETATE TC 99M INJECTION
10.0000 | Freq: Once | INTRAVENOUS | Status: AC | PRN
  Administered 2016-02-16: 10 via INTRAVENOUS

## 2016-02-17 ENCOUNTER — Other Ambulatory Visit: Payer: Self-pay | Admitting: Endocrinology

## 2016-02-17 ENCOUNTER — Ambulatory Visit (INDEPENDENT_AMBULATORY_CARE_PROVIDER_SITE_OTHER): Payer: BLUE CROSS/BLUE SHIELD | Admitting: Endocrinology

## 2016-02-17 VITALS — BP 122/80 | HR 120 | Wt 156.0 lb

## 2016-02-17 DIAGNOSIS — E0590 Thyrotoxicosis, unspecified without thyrotoxic crisis or storm: Secondary | ICD-10-CM

## 2016-02-17 MED ORDER — PROPYLTHIOURACIL 50 MG PO TABS: 200.0000 mg | ORAL_TABLET | Freq: Three times a day (TID) | ORAL | 3 refills | Status: DC

## 2016-02-17 NOTE — Progress Notes (Signed)
Subjective:    Patient ID: Nicole Lozano, female    DOB: Jan 31, 1976, 40 y.o.   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  She has resumed metoprolol.  She has recurrent palpitations and insomnia, tremor, heat intolerance, lightheadedness, and anxiety.  Past Medical History:  Diagnosis Date  . Anxiety   . History of chicken pox   . Thyroid disease     Past Surgical History:  Procedure Laterality Date  . APPENDECTOMY     Age 40  . CESAREAN SECTION  2009, 2011    Social History   Social History  . Marital status: Married    Spouse name: N/A  . Number of children: N/A  . Years of education: N/A   Occupational History  . Not on file.   Social History Main Topics  . Smoking status: Former Smoker    Start date: 03/12/2014  . Smokeless tobacco: Never Used  . Alcohol use 0.0 oz/week     Comment: occ  . Drug use: No  . Sexual activity: Not on file   Other Topics Concern  . Not on file   Social History Narrative  . No narrative on file    Current Outpatient Prescriptions on File Prior to Visit  Medication Sig Dispense Refill  . metoprolol succinate (TOPROL-XL) 50 MG 24 hr tablet Take 1 tablet (50 mg total) by mouth daily. Take with or immediately following a meal. 30 tablet 11  . venlafaxine XR (EFFEXOR-XR) 75 MG 24 hr capsule Take 1 capsule (75 mg total) by mouth daily with breakfast. 90 capsule 1  . hydrOXYzine (ATARAX/VISTARIL) 25 MG tablet Take 1 tablet (25 mg total) by mouth 3 (three) times daily as needed. (Patient not taking: Reported on 02/17/2016) 90 tablet 0   No current facility-administered medications on file prior to visit.     Allergies  Allergen Reactions  . Methimazole Itching    Family History  Problem Relation Age of  Onset  . Hypertension Father     Living  . Stroke Father   . Arthritis/Rheumatoid Mother     Living  . Arthritis/Rheumatoid Maternal Uncle   . Dementia Paternal Grandfather   . Dementia Paternal Grandmother   . Healthy Maternal Grandmother   . Healthy Sister     x1  . Healthy Son     x1  . Healthy Daughter     x1  . Thyroid disease Neg Hx    BP 122/80   Pulse (!) 120   Wt 156 lb (70.8 kg)   LMP 01/27/2016   SpO2 97%   BMI 28.53 kg/m   Review of Systems Denies LOC.      Objective:   Physical Exam VITAL SIGNS:  See vs page.   GENERAL: no distress.   NECK: 2-3 cm right thyroid nodule.  The thyroid is also 2-3 times normal size, in general.     nuc med scan: high diffuse uptake    Assessment & Plan:  Hyperthyroidism, due to Grave's Dz, worse off PTU. We discussed.  Pt says she wants to delay RAI until [place] to [place].    Patient is advised the following: Patient Instructions  please resume PTU, and:  Please continue the same metoprolol.  Let's do the radioactive iodine in mid-January.  you will receive a phone call, about a day and time for an appointment.  Please stop taking the PTU, 1 week prior.   Please come back for a follow-up appointment 6 weeks after the treatment.

## 2016-02-17 NOTE — Patient Instructions (Addendum)
please resume PTU, and:  Please continue the same metoprolol.   Let's do the radioactive iodine in mid-January.  you will receive a phone call, about a day and time for an appointment.  Please stop taking the PTU, 1 week prior.   Please come back for a follow-up appointment 6 weeks after the treatment.

## 2016-02-20 MED FILL — METOPROLOL SUCC ER 50 MG TA: 50 | 30 days supply | Qty: 30 | Fill #1

## 2016-02-20 MED FILL — PROPYLTHIOURACIL 50 MG TAB: 50 | 30 days supply | Qty: 360 | Fill #1

## 2016-02-21 ENCOUNTER — Encounter (HOSPITAL_COMMUNITY): Payer: BLUE CROSS/BLUE SHIELD

## 2016-02-22 ENCOUNTER — Other Ambulatory Visit (HOSPITAL_COMMUNITY): Payer: BLUE CROSS/BLUE SHIELD

## 2016-02-23 MED FILL — VENLAFAXINE HCL ER 75 MG CA: 75 | 30 days supply | Qty: 30 | Fill #1

## 2016-03-13 ENCOUNTER — Encounter: Payer: Self-pay | Admitting: Endocrinology

## 2016-03-15 ENCOUNTER — Encounter: Payer: Self-pay | Admitting: Endocrinology

## 2016-03-23 MED FILL — VENLAFAXINE HCL ER 75 MG CA: 75 | 30 days supply | Qty: 30 | Fill #2

## 2016-03-23 MED FILL — VENLAFAXINE HCL ER 37.5 MG: 37.5 | 30 days supply | Qty: 30 | Fill #1

## 2016-03-26 ENCOUNTER — Ambulatory Visit (HOSPITAL_COMMUNITY): Admission: RE | Admit: 2016-03-26 | Payer: BLUE CROSS/BLUE SHIELD | Source: Ambulatory Visit

## 2016-03-26 ENCOUNTER — Telehealth: Payer: Self-pay

## 2016-03-26 DIAGNOSIS — E0590 Thyrotoxicosis, unspecified without thyrotoxic crisis or storm: Secondary | ICD-10-CM

## 2016-03-26 NOTE — Telephone Encounter (Signed)
Ok, you will receive a phone call, about a day and time for an appointment.  The risk is higher than if the thyroid was normal, but they'll check your heart rate.  Please continue the same metoprolol.

## 2016-03-26 NOTE — Telephone Encounter (Signed)
Patient called in and stated she would like the referral to the  surgery specialist. She also wanted to check if you wanted her to continue her PTU medication at this time and what the risk of surgery would be if her thyroid was severely overactive?

## 2016-03-26 NOTE — Telephone Encounter (Signed)
Patient do not want to go through with neurology appointment, she cancelled the appointment.    She ask if you would call her

## 2016-03-26 NOTE — Telephone Encounter (Signed)
I contacted the patient. Message FWD to Dr. Everardo AllEllison on a sperate telephone note.

## 2016-03-27 ENCOUNTER — Telehealth: Payer: Self-pay

## 2016-03-27 MED ORDER — METOPROLOL SUCCINATE ER 50 MG PO TB24: 50.0000 mg | ORAL_TABLET | Freq: Every day | ORAL | 11 refills | Status: DC

## 2016-03-27 NOTE — Telephone Encounter (Signed)
If at all possible, please continue the medication.  This reduces the risks of surgery

## 2016-03-27 NOTE — Telephone Encounter (Signed)
Patient called and reported she has a surgery consult on 04/19/2016. Patient wanted to know if she should be off her thyroid medication until this appointment. Patient stated she has started to experience hair loss and palpitations and is concerned about this. Please advise, Thanks!

## 2016-03-27 NOTE — Telephone Encounter (Signed)
I contacted the patient and advised of messages. Pateint voiced understanding and had no further questions at this time.

## 2016-03-27 NOTE — Telephone Encounter (Signed)
Yes, please.

## 2016-03-27 NOTE — Telephone Encounter (Signed)
Patient notified of message and she voiced understanding and had no further questions.

## 2016-03-27 NOTE — Telephone Encounter (Addendum)
Just wanted to verify, should the patient continue to stay off the PTU?

## 2016-04-06 MED FILL — PROPYLTHIOURACIL 50 MG TAB: 50 | 30 days supply | Qty: 360 | Fill #0

## 2016-04-06 MED FILL — METOPROLOL SUCC ER 50 MG TA: 50 | 30 days supply | Qty: 30 | Fill #0

## 2016-04-09 ENCOUNTER — Encounter: Payer: Self-pay | Admitting: Endocrinology

## 2016-04-11 ENCOUNTER — Telehealth: Payer: Self-pay

## 2016-04-11 NOTE — Telephone Encounter (Signed)
Attempted to reach the patient to schedule an office visit. Will try again tomorrow.

## 2016-04-12 ENCOUNTER — Ambulatory Visit (INDEPENDENT_AMBULATORY_CARE_PROVIDER_SITE_OTHER): Payer: BLUE CROSS/BLUE SHIELD | Admitting: Endocrinology

## 2016-04-12 ENCOUNTER — Encounter: Payer: Self-pay | Admitting: Endocrinology

## 2016-04-12 VITALS — BP 122/80 | HR 106 | Ht 62.0 in | Wt 157.0 lb

## 2016-04-12 DIAGNOSIS — E0590 Thyrotoxicosis, unspecified without thyrotoxic crisis or storm: Secondary | ICD-10-CM

## 2016-04-12 LAB — T4, FREE: FREE T4: 1.05 ng/dL (ref 0.60–1.60)

## 2016-04-12 LAB — T3, FREE: T3 FREE: 4.5 pg/mL — AB (ref 2.3–4.2)

## 2016-04-12 LAB — TSH: TSH: 0.02 u[IU]/mL — ABNORMAL LOW (ref 0.35–4.50)

## 2016-04-12 MED ORDER — METOPROLOL SUCCINATE ER 25 MG PO TB24: 75.0000 mg | ORAL_TABLET | Freq: Every day | ORAL | 3 refills | Status: DC

## 2016-04-12 NOTE — Telephone Encounter (Signed)
I contacted the patient and scheduled a office visit for 04/12/2016 at 11 am.

## 2016-04-12 NOTE — Patient Instructions (Addendum)
blood tests are requested for you today.  We'll let you know about the results. Please see the surgery doctor next week, as scheduled. I have sent a prescription to your pharmacy, to increase the metoprolol.  If you have the surgery, please come back here afterward.

## 2016-04-12 NOTE — Progress Notes (Signed)
Subjective:    Patient ID: Nicole Lozano, female    DOB: 11/30/1975, 41 y.o.   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  She has requested surgical rx.  She has surg appt next week.  She takes PTU as rx'ed.  She has slight lightheadedness, difficulty with concentration, cold intolerance, and sob.  Past Medical History:  Diagnosis Date  . Anxiety   . History of chicken pox   . Thyroid disease     Past Surgical History:  Procedure Laterality Date  . APPENDECTOMY     Age 27  . CESAREAN SECTION  2009, 2011    Social History   Social History  . Marital status: Married    Spouse name: N/A  . Number of children: N/A  . Years of education: N/A   Occupational History  . Not on file.   Social History Main Topics  . Smoking status: Former Smoker    Start date: 03/12/2014  . Smokeless tobacco: Never Used  . Alcohol use 0.0 oz/week     Comment: occ  . Drug use: No  . Sexual activity: Not on file   Other Topics Concern  . Not on file   Social History Narrative  . No narrative on file    Current Outpatient Prescriptions on File Prior to Visit  Medication Sig Dispense Refill  . hydrOXYzine (ATARAX/VISTARIL) 25 MG tablet Take 1 tablet (25 mg total) by mouth 3 (three) times daily as needed. 90 tablet 0  . propylthiouracil (PTU) 50 MG tablet Take 4 tablets (200 mg total) by mouth 3 (three) times daily. 360 tablet 3  . venlafaxine XR (EFFEXOR-XR) 75 MG 24 hr capsule Take 1 capsule (75 mg total) by mouth daily with breakfast. 90 capsule 1   No current facility-administered medications on file prior to visit.     Allergies  Allergen Reactions  . Methimazole Itching    Family History  Problem Relation Age of Onset  . Hypertension Father     Living  . Stroke Father   . Arthritis/Rheumatoid Mother    Living  . Arthritis/Rheumatoid Maternal Uncle   . Dementia Paternal Grandfather   . Dementia Paternal Grandmother   . Healthy Maternal Grandmother   . Healthy Sister     x1  . Healthy Son     x1  . Healthy Daughter     x1  . Thyroid disease Neg Hx     BP 122/80   Pulse (!) 106   Ht 5\' 2"  (1.575 m)   Wt 157 lb (71.2 kg)   SpO2 96%   BMI 28.72 kg/m    Review of Systems Denies fever.     Objective:   Physical Exam VITAL SIGNS:  See vs page GENERAL: no distress NECK: 2-3 cm right thyroid nodule.  The thyroid is also 2-3 times normal size, in general.    Lab Results  Component Value Date   TSH 0.02 (L) 04/12/2016      Assessment & Plan:  Hyperthyroidism, improved on rx.  Patient is advised the following: Patient Instructions  blood tests are requested for you today.  We'll let you know about the results. Please see the surgery doctor next week, as scheduled. I have sent a prescription to your pharmacy, to increase the metoprolol.  If you have the  surgery, please come back here afterward.

## 2016-04-13 LAB — THYROID PEROXIDASE ANTIBODY: THYROID PEROXIDASE ANTIBODY: 40 [IU]/mL — AB (ref <9)

## 2016-04-17 ENCOUNTER — Other Ambulatory Visit: Payer: Self-pay | Admitting: Physician Assistant

## 2016-04-17 MED FILL — VENLAFAXINE HCL ER 75 MG CA: 75 | 30 days supply | Qty: 30 | Fill #3

## 2016-04-19 ENCOUNTER — Ambulatory Visit: Payer: Self-pay | Admitting: Surgery

## 2016-04-30 ENCOUNTER — Encounter: Payer: Self-pay | Admitting: Physician Assistant

## 2016-05-04 ENCOUNTER — Other Ambulatory Visit: Payer: Self-pay | Admitting: Physician Assistant

## 2016-05-04 MED FILL — METOPROLOL SUCC ER 50 MG TA: 50 | 30 days supply | Qty: 30 | Fill #1

## 2016-05-07 MED ORDER — VENLAFAXINE HCL ER 37.5 MG PO CP24: 37.5000 mg | ORAL_CAPSULE | Freq: Every day | ORAL | 1 refills | Status: DC

## 2016-05-07 MED FILL — VENLAFAXINE HCL ER 37.5 MG: 37.5 | 30 days supply | Qty: 30 | Fill #0

## 2016-05-07 NOTE — Addendum Note (Signed)
Addended by: Marcelline MatesMARTIN, Donathan Buller on: 05/07/2016 08:24 AM   Modules accepted: Orders

## 2016-05-08 ENCOUNTER — Ambulatory Visit: Payer: Self-pay | Admitting: Physician Assistant

## 2016-05-09 ENCOUNTER — Ambulatory Visit (HOSPITAL_COMMUNITY)
Admission: RE | Admit: 2016-05-09 | Discharge: 2016-05-09 | Disposition: A | Discharge status: Home / Self Care | Payer: BLUE CROSS/BLUE SHIELD | Source: Ambulatory Visit | Attending: Anesthesiology | Admitting: Anesthesiology

## 2016-05-09 ENCOUNTER — Encounter (HOSPITAL_COMMUNITY)
Admission: RE | Admit: 2016-05-09 | Discharge: 2016-05-09 | Disposition: A | Discharge status: Home / Self Care | Payer: BLUE CROSS/BLUE SHIELD | Source: Ambulatory Visit | Attending: Surgery | Admitting: Surgery

## 2016-05-09 ENCOUNTER — Encounter (HOSPITAL_COMMUNITY): Payer: Self-pay

## 2016-05-09 DIAGNOSIS — Z01818 Encounter for other preprocedural examination: Secondary | ICD-10-CM | POA: Diagnosis not present

## 2016-05-09 DIAGNOSIS — I10 Essential (primary) hypertension: Secondary | ICD-10-CM | POA: Diagnosis not present

## 2016-05-09 DIAGNOSIS — R9431 Abnormal electrocardiogram [ECG] [EKG]: Secondary | ICD-10-CM | POA: Diagnosis not present

## 2016-05-09 DIAGNOSIS — E0590 Thyrotoxicosis, unspecified without thyrotoxic crisis or storm: Secondary | ICD-10-CM | POA: Insufficient documentation

## 2016-05-09 DIAGNOSIS — E0500 Thyrotoxicosis with diffuse goiter without thyrotoxic crisis or storm: Secondary | ICD-10-CM | POA: Diagnosis not present

## 2016-05-09 DIAGNOSIS — Z01810 Encounter for preprocedural cardiovascular examination: Secondary | ICD-10-CM | POA: Insufficient documentation

## 2016-05-09 DIAGNOSIS — Z01812 Encounter for preprocedural laboratory examination: Secondary | ICD-10-CM | POA: Diagnosis not present

## 2016-05-09 DIAGNOSIS — M2550 Pain in unspecified joint: Secondary | ICD-10-CM | POA: Insufficient documentation

## 2016-05-09 DIAGNOSIS — F418 Other specified anxiety disorders: Secondary | ICD-10-CM | POA: Insufficient documentation

## 2016-05-09 HISTORY — DX: Essential (primary) hypertension: I10

## 2016-05-09 HISTORY — DX: Headache, unspecified: R51.9

## 2016-05-09 HISTORY — DX: Dyspnea, unspecified: R06.00

## 2016-05-09 HISTORY — DX: Headache: R51

## 2016-05-09 LAB — CBC
HEMATOCRIT: 44.9 % (ref 36.0–46.0)
Hemoglobin: 15.7 g/dL — ABNORMAL HIGH (ref 12.0–15.0)
MCH: 30.5 pg (ref 26.0–34.0)
MCHC: 35 g/dL (ref 30.0–36.0)
MCV: 87.4 fL (ref 78.0–100.0)
Platelets: 285 10*3/uL (ref 150–400)
RBC: 5.14 MIL/uL — ABNORMAL HIGH (ref 3.87–5.11)
RDW: 13.7 % (ref 11.5–15.5)
WBC: 6.4 10*3/uL (ref 4.0–10.5)

## 2016-05-09 LAB — COMPREHENSIVE METABOLIC PANEL
ALBUMIN: 4.2 g/dL (ref 3.5–5.0)
ALT: 113 U/L — ABNORMAL HIGH (ref 14–54)
AST: 42 U/L — AB (ref 15–41)
Alkaline Phosphatase: 97 U/L (ref 38–126)
Anion gap: 11 (ref 5–15)
BUN: 13 mg/dL (ref 6–20)
CHLORIDE: 102 mmol/L (ref 101–111)
CO2: 23 mmol/L (ref 22–32)
Calcium: 10.2 mg/dL (ref 8.9–10.3)
Creatinine, Ser: 0.65 mg/dL (ref 0.44–1.00)
GFR calc Af Amer: >60 mL/min (ref >60)
GFR calc non Af Amer: >60 mL/min (ref >60)
GLUCOSE: 89 mg/dL (ref 65–99)
POTASSIUM: 4.4 mmol/L (ref 3.5–5.1)
Sodium: 136 mmol/L (ref 135–145)
Total Bilirubin: 0.6 mg/dL (ref 0.3–1.2)
Total Protein: 7.3 g/dL (ref 6.5–8.1)

## 2016-05-09 LAB — HCG, SERUM, QUALITATIVE: PREG SERUM: NEGATIVE

## 2016-05-09 LAB — PROTIME-INR
INR: 0.91
Prothrombin Time: 12.2 seconds (ref 11.4–15.2)

## 2016-05-09 NOTE — Progress Notes (Signed)
Pt. Presented to PCP (Dr. Daphine DeutscherMartin- [place]) with rapid heart rate. She has been on Metoprolol but in coordinating her care she was found to have abnormal Thyroid studies & referred to General surgery. Pt. Remarked about the liver being involved with a reaction to PTU-identified that its a "liver problem specifically because I have itching of my palms & bottoms of my feet". Pt. Uses Hydroxizine PRN for this complaint. Pt. Denies any chest concerns, she reports that she has not been referred to cardiololgy ever & doesn't recall ever having an ekg in the past.

## 2016-05-09 NOTE — Pre-Procedure Instructions (Signed)
Nicole AblerFrida M Lozano  05/09/2016      [facility] - [place], [place] - [id number] [place] [address] Phone: [phone] Fax: [phone]  [facility] - [place], [place] - [address] [place] [address] Phone: [phone] Fax: [phone]    Your procedure is scheduled on 05/15/2016  [facility] Ad[facility] at 5:30 A.M.  Call this number if you have problems the morning of surgery:  [phone]   Remember:  Do not eat food or drink liquids after midnight.  On Monday night    Take these medicines the morning of surgery with A SIP OF WATER :Metoprolol, Effexor, can take hydrocodone if needed     Do not wear jewelry, make-up or nail polish.   Do not wear lotions, powders, or perfumes, or deoderant.   Do not shave 48 hours prior to surgery.     Do not bring valuables to the hospital.   [facility] is not responsible for any belongings or valuables.  Contacts, dentures or bridgework may not be worn into surgery.  Leave your suitcase in the car.  After surgery it may be brought to your room.  For patients admitted to the hospital, discharge time will be determined by your treatment team.  Patients discharged the day of surgery will not be allowed to drive home.   Name and phone number of your driver:   With spouse   Special instructions:  Special Instructions: [place] - Preparing for Surgery  Before surgery, you can play an important role.  Because skin is not sterile, your skin needs to be as free of germs as possible.  You can reduce the number of germs on you skin by washing with CHG (chlorahexidine gluconate) soap before surgery.  CHG is an antiseptic cleaner which kills germs and bonds with the skin to continue killing germs even after washing.  Please DO NOT use if you have an allergy to CHG or antibacterial soaps.  If your  skin becomes reddened/irritated stop using the CHG and inform your nurse when you arrive at Short Stay.  Do not shave (including legs and underarms) for at least 48 hours prior to the first CHG shower.  You may shave your face.  Please follow these instructions carefully:   1.  Shower with CHG Soap the night before surgery and the  morning of Surgery.  2.  If you choose to wash your hair, wash your hair first as usual with your  normal shampoo.  3.  After you shampoo, rinse your hair and body thoroughly to remove the  Shampoo.  4.  Use CHG as you would any other liquid soap.  You can apply chg directly to the skin and wash gently with scrungie or a clean washcloth.  5.  Apply the CHG Soap to your body ONLY FROM THE NECK DOWN.    Do not use on open wounds or open sores.  Avoid contact with your eyes, ears, mouth and genitals (private parts).  Wash genitals (private parts)   with your normal soap.  6.  Wash thoroughly, paying special attention to the area where your surgery will be performed.  7.  Thoroughly rinse your body with warm water from the neck down.  8.  DO NOT shower/wash with your normal soap after using and rinsing off   the CHG Soap.  9.  Pat yourself dry with a clean towel.            10.  Wear clean pajamas.            11.  Place clean sheets on your bed the night of your first shower and do not sleep with pets.  Day of Surgery  Do not apply any lotions/deodorants the morning of surgery.  Please wear clean clothes to the hospital/surgery center.  Please read over the following fact sheets that you were given. Pain Booklet, Coughing and Deep Breathing and Surgical Site Infection Prevention

## 2016-05-10 ENCOUNTER — Encounter (HOSPITAL_COMMUNITY): Payer: Self-pay

## 2016-05-10 NOTE — Progress Notes (Signed)
Anesthesia Chart Review:  Pt is a 41 year old female scheduled for total thyroidectomy on 05/15/2016 with Darnell Levelodd Gerkin, MD  - PCP is Marcelline MatesWilliam Martin, PA - Endocrinologist is Romero BellingSean Ellison, MD.   PMH includes:  HTN, hyperlipidemia, hyperthyroidism Luiz Blare(Graves' disease). Former smoker. BMI 30  Medications include: metoprolol, PTU  Preoperative labs reviewed.  AST 42, ALT 113.   CXR 05/09/16: There is no active cardiopulmonary disease.  EKG 05/09/16: NSR. Nonspecific T wave abnormality  Reviewed case with Dr. Michelle Piperssey.   If no changes, I anticipate pt can proceed with surgery as scheduled.   Rica Mastngela Tosca Pletz, FNP-BC [facility] Phone: [phone] 05/10/2016 3:02 PM

## 2016-05-11 ENCOUNTER — Other Ambulatory Visit: Payer: Self-pay | Admitting: Emergency Medicine

## 2016-05-11 ENCOUNTER — Encounter: Payer: Self-pay | Admitting: Physician Assistant

## 2016-05-11 ENCOUNTER — Ambulatory Visit (INDEPENDENT_AMBULATORY_CARE_PROVIDER_SITE_OTHER): Payer: BLUE CROSS/BLUE SHIELD | Admitting: Physician Assistant

## 2016-05-11 VITALS — BP 118/80 | HR 107 | Temp 99.0°F | Resp 14 | Ht 62.0 in | Wt 158.0 lb

## 2016-05-11 DIAGNOSIS — M5090 Cervical disc disorder, unspecified, unspecified cervical region: Secondary | ICD-10-CM | POA: Diagnosis not present

## 2016-05-11 MED ORDER — VENLAFAXINE HCL ER 37.5 MG PO CP24: 37.5000 mg | ORAL_CAPSULE | Freq: Every day | ORAL | 1 refills | Status: DC

## 2016-05-11 MED ORDER — PREGABALIN 50 MG PO CAPS
50.0000 mg | ORAL_CAPSULE | Freq: Two times a day (BID) | ORAL | 1 refills | Status: DC
Start: 2016-05-11 — End: 2016-07-03

## 2016-05-11 MED ORDER — VENLAFAXINE HCL ER 75 MG PO CP24: 75.0000 mg | ORAL_CAPSULE | Freq: Every day | ORAL | 1 refills | Status: DC

## 2016-05-11 MED FILL — VENLAFAXINE HCL ER 75 MG CA: 75 | 34 days supply | Qty: 34 | Fill #0
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
## 2016-05-11 NOTE — Patient Instructions (Signed)
I am going to start you on Lyrica 50 mg twice daily for nerve pain. This should offer better relief of symptoms.  We can increase dose later if needed but at that point the goal is to have imaging performed and you seeing a specialist.  Tylenol or Motrin if needed for breakthrough pain.  Follow-up with Dr. Everardo AllEllison as scheduled for thyroid procedure.   Follow-up with me in 1 month. If you note any dizziness or worsening mood on the Lyrica, stop medication and call me.

## 2016-05-11 NOTE — Progress Notes (Signed)
Pre visit review using our clinic review tool, if applicable. No additional management support is needed unless otherwise documented below in the visit note. 

## 2016-05-14 ENCOUNTER — Encounter (HOSPITAL_COMMUNITY): Payer: Self-pay | Admitting: Surgery

## 2016-05-14 NOTE — H&P (Signed)
General Surgery [facility]  Nicole CrankerFrida Lozano DOB: [birth year] Married / Language: English / Race: Refused to Report/Unreported Female  History of Present Illness  The patient is a 41 year old female who presents with Graves disease.  Patient is referred by Dr. Romero BellingSean Ellison for evaluation and surgical management of Graves' disease. Patient was diagnosed in October 2017. She had developed anxiety and tachycardia. She was placed on beta blockade and anti-thyroid medications. She had an allergy to Tapazole and was switched to propylthiouracil. She is taking metoprolol 50 mg daily. Patient had considered radioactive iodine treatment. However she was noted to have a nodular right lobe of the thyroid which was mildly enlarged. Patient wished to seek surgical consultation to discuss options for treatment. Patient has had no prior thyroid problems until last year. She had never been on thyroid medication. She has had no prior head or neck surgery. There is no immediate family history of thyroid disease or other endocrine neoplasms. Patient has had tremors which had been controlled with beta blockade. She is accompanied today by her husband.   Past Surgical History  Cesarean Section - Multiple   Diagnostic Studies History Colonoscopy  >10 years ago Mammogram  1-3 years ago  Allergies  MethIMAzole *THYROID AGENTS*  Itching. Allergies Reconciled   Medication History  HydrOXYzine HCl (25MG  Tablet, Oral as needed) Active. Metoprolol Succinate ER (25MG  Tablet ER 24HR, Oral daily) Active. Propylthiouracil (50MG  Tablet, Oral daily) Active. Venlafaxine HCl ER (75MG  Capsule ER 24HR, Oral daily) Active. Medications Reconciled  Social History Alcohol use  Occasional alcohol use. Caffeine use  Coffee. No drug use  Tobacco use  Former smoker.  Family History Arthritis  Family Members In General, Mother. Cerebrovascular Accident  Father. Hypertension   Father.  Pregnancy / Birth History  Age at menarche  13 years. Gravida  2 Length (months) of breastfeeding  7-12 Maternal age  39-30 Para  2 Regular periods   Other Problems  Depression  Thyroid Disease    Review of Systems  General Present- Night Sweats and Weight Gain. Not Present- Appetite Loss, Chills, Fatigue, Fever and Weight Loss. Skin Present- Dryness. Not Present- Change in Wart/Mole, Hives, Jaundice, New Lesions, Non-Healing Wounds, Rash and Ulcer. Cardiovascular Present- Rapid Heart Rate and Shortness of Breath. Not Present- Chest Pain, Difficulty Breathing Lying Down, Leg Cramps, Palpitations and Swelling of Extremities. Musculoskeletal Present- Joint Pain, Muscle Pain and Muscle Weakness. Not Present- Back Pain, Joint Stiffness and Swelling of Extremities. Neurological Present- Headaches, Numbness and Tingling. Not Present- Decreased Memory, Fainting, Seizures, Tremor, Trouble walking and Weakness. Psychiatric Present- Anxiety and Depression. Not Present- Bipolar, Change in Sleep Pattern, Fearful and Frequent crying. Endocrine Present- Hair Changes. Not Present- Cold Intolerance, Excessive Hunger, Heat Intolerance, Hot flashes and New Diabetes.  Vitals  Weight: 157.2 lb Height: 62in Body Surface Area: 1.73 m Body Mass Index: 28.75 kg/m  Temp.: 98.108F  Pulse: 107 (Regular)  BP: 122/78 (Sitting, Left Arm, Standard)   Physical Exam   The physical exam findings are as follows: Note:General - appears comfortable, no distress; not diaphorectic  HEENT - normocephalic; sclerae clear, gaze conjugate; mucous membranes moist, dentition good; voice normal  Neck - asymmetric on extension; no palpable anterior or posterior cervical adenopathy; thyroid gland is firm to palpation; right lobe is mildly enlarged and nodular without discrete or dominant mass; left lobe is slightly smaller but still firm without dominant mass; there is no tenderness  Chest -  clear bilaterally without rhonchi, rales, or  wheeze  Cor - regular rhythm with normal rate; no significant murmur  Ext - non-tender without significant edema or lymphedema  Neuro - grossly intact; mild tremor bilaterally   Assessment & Plan   GRAVES DISEASE (E05.00)  Pt Education - Pamphlet Given - The Thyroid Book: discussed with patient and provided information.  Patient is referred by her endocrinologist for evaluation for surgical treatment of Graves' disease. Patient is accompanied by her husband. AR provided with written literature on thyroid surgery to review at home.  Patient has Graves' disease which is currently controlled on beta blockade and propylthiouracil. We discussed options for treatment including radioactive iodine ablation and total thyroidectomy. We discussed the procedure of total thyroidectomy in detail including potential complications including recurrent laryngeal nerve injury and injury to parathyroid glands. We discussed the need for lifelong thyroid hormone replacement therapy after surgery. We discussed the procedure, the hospital stay, the location of the surgical incision, and the cosmetic results to be anticipated. We discussed postoperative recovery and return to activity. Patient and her husband understand and wish to proceed with surgery in the near future.  The risks and benefits of the procedure have been discussed at length with the patient. The patient understands the proposed procedure, potential alternative treatments, and the course of recovery to be expected. All of the patient's questions have been answered at this time. The patient wishes to proceed with surgery.  Velora Heckler, MD, FACS General & Endocrine Surgery [facility] Office: [phone]

## 2016-05-14 NOTE — Anesthesia Preprocedure Evaluation (Addendum)
Anesthesia Evaluation  Patient identified by MRN, date of birth, ID band Patient awake    Reviewed: Allergy & Precautions, H&P , NPO status , Patient's Chart, lab work & pertinent test results, reviewed documented beta blocker date and time   Airway Mallampati: II  TM Distance: >3 FB Neck ROM: Full    Dental no notable dental hx. (+) Teeth Intact, Dental Advisory Given   Pulmonary neg pulmonary ROS, former smoker,    Pulmonary exam normal breath sounds clear to auscultation       Cardiovascular Exercise Tolerance: Good hypertension, Pt. on medications and Pt. on home beta blockers  Rhythm:Regular Rate:Normal     Neuro/Psych  Headaches, Anxiety negative psych ROS   GI/Hepatic negative GI ROS, Neg liver ROS,   Endo/Other  Hyperthyroidism   Renal/GU negative Renal ROS  negative genitourinary   Musculoskeletal   Abdominal   Peds  Hematology negative hematology ROS (+)   Anesthesia Other Findings   Reproductive/Obstetrics negative OB ROS                           Anesthesia Physical Anesthesia Plan  ASA: II  Anesthesia Plan: General   Post-op Pain Management:    Induction: Intravenous  Airway Management Planned: Oral ETT  Additional Equipment:   Intra-op Plan:   Post-operative Plan: Extubation in OR  Informed Consent: I have reviewed the patients History and Physical, chart, labs and discussed the procedure including the risks, benefits and alternatives for the proposed anesthesia with the patient or authorized representative who has indicated his/her understanding and acceptance.   Dental advisory given  Plan Discussed with: CRNA  Anesthesia Plan Comments:         Anesthesia Quick Evaluation

## 2016-05-15 ENCOUNTER — Encounter (HOSPITAL_COMMUNITY): Payer: Self-pay | Admitting: *Deleted

## 2016-05-15 ENCOUNTER — Ambulatory Visit (HOSPITAL_COMMUNITY): Payer: BLUE CROSS/BLUE SHIELD | Admitting: Anesthesiology

## 2016-05-15 ENCOUNTER — Observation Stay (HOSPITAL_COMMUNITY)
Admission: RE | Admit: 2016-05-15 | Discharge: 2016-05-16 | Disposition: A | Discharge status: Home / Self Care | Payer: BLUE CROSS/BLUE SHIELD | Source: Ambulatory Visit | Attending: Surgery | Admitting: Surgery

## 2016-05-15 ENCOUNTER — Ambulatory Visit (HOSPITAL_COMMUNITY): Payer: BLUE CROSS/BLUE SHIELD | Admitting: Emergency Medicine

## 2016-05-15 ENCOUNTER — Encounter (HOSPITAL_COMMUNITY)
Admission: RE | Disposition: A | Discharge status: Home / Self Care | Payer: Self-pay | Source: Ambulatory Visit | Attending: Surgery

## 2016-05-15 DIAGNOSIS — I10 Essential (primary) hypertension: Secondary | ICD-10-CM | POA: Insufficient documentation

## 2016-05-15 DIAGNOSIS — F329 Major depressive disorder, single episode, unspecified: Secondary | ICD-10-CM | POA: Insufficient documentation

## 2016-05-15 DIAGNOSIS — E0500 Thyrotoxicosis with diffuse goiter without thyrotoxic crisis or storm: Principal | ICD-10-CM | POA: Insufficient documentation

## 2016-05-15 DIAGNOSIS — Z79899 Other long term (current) drug therapy: Secondary | ICD-10-CM | POA: Insufficient documentation

## 2016-05-15 DIAGNOSIS — E0590 Thyrotoxicosis, unspecified without thyrotoxic crisis or storm: Secondary | ICD-10-CM

## 2016-05-15 DIAGNOSIS — F419 Anxiety disorder, unspecified: Secondary | ICD-10-CM | POA: Diagnosis not present

## 2016-05-15 DIAGNOSIS — Z87891 Personal history of nicotine dependence: Secondary | ICD-10-CM | POA: Insufficient documentation

## 2016-05-15 HISTORY — DX: Thyrotoxicosis with diffuse goiter without thyrotoxic crisis or storm: E05.00

## 2016-05-15 HISTORY — PX: THYROIDECTOMY: SHX17

## 2016-05-15 HISTORY — PX: EXCISION OF SKIN TAG: SHX6270

## 2016-05-15 HISTORY — PX: TOTAL THYROIDECTOMY: SHX2547

## 2016-05-15 HISTORY — DX: Sprain of ligaments of cervical spine, initial encounter: S13.4XXA

## 2016-05-15 HISTORY — DX: Hypothyroidism, unspecified: E03.9

## 2016-05-15 HISTORY — DX: Pneumonia, unspecified organism: J18.9

## 2016-05-15 SURGERY — THYROIDECTOMY
Anesthesia: General | Site: Neck | Laterality: Right

## 2016-05-15 MED ORDER — METOPROLOL SUCCINATE ER 50 MG PO TB24
50.0000 mg | ORAL_TABLET | Freq: Every day | ORAL | Status: DC
  Filled 2016-05-15: qty 1

## 2016-05-15 MED ORDER — ONDANSETRON HCL 4 MG/2ML IJ SOLN: 4.0000 mg | Freq: Four times a day (QID) | INTRAMUSCULAR | Status: DC | PRN

## 2016-05-15 MED ORDER — ONDANSETRON HCL 4 MG/2ML IJ SOLN
INTRAMUSCULAR | Status: DC | PRN
  Administered 2016-05-15: 4 mg via INTRAVENOUS

## 2016-05-15 MED ORDER — PROPOFOL 10 MG/ML IV BOLUS
INTRAVENOUS | Status: AC
  Filled 2016-05-15: qty 40

## 2016-05-15 MED ORDER — PREGABALIN 50 MG PO CAPS
50.0000 mg | ORAL_CAPSULE | Freq: Two times a day (BID) | ORAL | Status: DC
  Filled 2016-05-15: qty 1

## 2016-05-15 MED ORDER — LIDOCAINE HCL (CARDIAC) 20 MG/ML IV SOLN
INTRAVENOUS | Status: DC | PRN
  Administered 2016-05-15: 60 mg via INTRAVENOUS

## 2016-05-15 MED ORDER — ARTIFICIAL TEARS OP OINT
TOPICAL_OINTMENT | OPHTHALMIC | Status: DC | PRN
  Administered 2016-05-15: 1 via OPHTHALMIC

## 2016-05-15 MED ORDER — CHLORHEXIDINE GLUCONATE CLOTH 2 % EX PADS: 6.0000 | MEDICATED_PAD | Freq: Once | CUTANEOUS | Status: DC

## 2016-05-15 MED ORDER — BACITRACIN ZINC 500 UNIT/GM EX OINT
TOPICAL_OINTMENT | CUTANEOUS | Status: AC
  Filled 2016-05-15: qty 28.35

## 2016-05-15 MED ORDER — HYDROMORPHONE HCL 1 MG/ML IJ SOLN
INTRAMUSCULAR | Status: AC
  Filled 2016-05-15: qty 0.5

## 2016-05-15 MED ORDER — 0.9 % SODIUM CHLORIDE (POUR BTL) OPTIME
TOPICAL | Status: DC | PRN
  Administered 2016-05-15: 1000 mL

## 2016-05-15 MED ORDER — FENTANYL CITRATE (PF) 100 MCG/2ML IJ SOLN
INTRAMUSCULAR | Status: DC | PRN
  Administered 2016-05-15 (×2): 50 ug via INTRAVENOUS
  Administered 2016-05-15: 25 ug via INTRAVENOUS
  Administered 2016-05-15: 50 ug via INTRAVENOUS
  Administered 2016-05-15: 25 ug via INTRAVENOUS

## 2016-05-15 MED ORDER — HYDROMORPHONE HCL 1 MG/ML IJ SOLN
INTRAMUSCULAR | Status: AC
  Administered 2016-05-15: 0.5 mg via INTRAVENOUS
  Filled 2016-05-15: qty 0.5

## 2016-05-15 MED ORDER — ROCURONIUM BROMIDE 100 MG/10ML IV SOLN
INTRAVENOUS | Status: DC | PRN
  Administered 2016-05-15: 50 mg via INTRAVENOUS
  Administered 2016-05-15: 20 mg via INTRAVENOUS

## 2016-05-15 MED ORDER — VENLAFAXINE HCL ER 37.5 MG PO CP24
37.5000 mg | ORAL_CAPSULE | Freq: Every day | ORAL | Status: DC
  Administered 2016-05-16: 37.5 mg via ORAL
  Filled 2016-05-15: qty 1

## 2016-05-15 MED ORDER — ACETAMINOPHEN 325 MG PO TABS: 650.0000 mg | ORAL_TABLET | Freq: Four times a day (QID) | ORAL | Status: DC | PRN

## 2016-05-15 MED ORDER — HEMOSTATIC AGENTS (NO CHARGE) OPTIME
TOPICAL | Status: DC | PRN
  Administered 2016-05-15: 1 via TOPICAL

## 2016-05-15 MED ORDER — PHENYLEPHRINE HCL 10 MG/ML IJ SOLN
INTRAMUSCULAR | Status: DC | PRN
  Administered 2016-05-15: 80 ug via INTRAVENOUS
  Administered 2016-05-15 (×2): 40 ug via INTRAVENOUS

## 2016-05-15 MED ORDER — CALCIUM CARBONATE 1250 (500 CA) MG PO TABS
2.0000 | ORAL_TABLET | Freq: Three times a day (TID) | ORAL | Status: DC
  Administered 2016-05-15 – 2016-05-16 (×2): 1000 mg via ORAL
  Filled 2016-05-15 (×2): qty 1

## 2016-05-15 MED ORDER — MIDAZOLAM HCL 5 MG/5ML IJ SOLN
INTRAMUSCULAR | Status: DC | PRN
  Administered 2016-05-15 (×2): 1 mg via INTRAVENOUS

## 2016-05-15 MED ORDER — LIDOCAINE HCL 4 % EX SOLN
CUTANEOUS | Status: DC | PRN
  Administered 2016-05-15: 2 mL via TOPICAL

## 2016-05-15 MED ORDER — LACTATED RINGERS IV SOLN
INTRAVENOUS | Status: DC | PRN
  Administered 2016-05-15 (×2): via INTRAVENOUS

## 2016-05-15 MED ORDER — ACETAMINOPHEN 650 MG RE SUPP: 650.0000 mg | Freq: Four times a day (QID) | RECTAL | Status: DC | PRN

## 2016-05-15 MED ORDER — SUGAMMADEX SODIUM 200 MG/2ML IV SOLN
INTRAVENOUS | Status: DC | PRN
  Administered 2016-05-15: 150 mg via INTRAVENOUS

## 2016-05-15 MED ORDER — ONDANSETRON 4 MG PO TBDP: 4.0000 mg | ORAL_TABLET | Freq: Four times a day (QID) | ORAL | Status: DC | PRN

## 2016-05-15 MED ORDER — HYDROMORPHONE HCL 2 MG/ML IJ SOLN
1.0000 mg | INTRAMUSCULAR | Status: DC | PRN
  Administered 2016-05-15 (×3): 1 mg via INTRAVENOUS
  Filled 2016-05-15 (×3): qty 1

## 2016-05-15 MED ORDER — PHENOL 1.4 % MT LIQD
1.0000 | OROMUCOSAL | Status: DC | PRN
  Administered 2016-05-15: 1 via OROMUCOSAL
  Filled 2016-05-15: qty 177

## 2016-05-15 MED ORDER — PROPOFOL 10 MG/ML IV BOLUS
INTRAVENOUS | Status: DC | PRN
  Administered 2016-05-15: 130 mg via INTRAVENOUS

## 2016-05-15 MED ORDER — KCL IN DEXTROSE-NACL 20-5-0.45 MEQ/L-%-% IV SOLN
INTRAVENOUS | Status: DC
  Administered 2016-05-15: 12:00:00 via INTRAVENOUS
  Filled 2016-05-15: qty 1000

## 2016-05-15 MED ORDER — FENTANYL CITRATE (PF) 100 MCG/2ML IJ SOLN
INTRAMUSCULAR | Status: AC
  Filled 2016-05-15: qty 4

## 2016-05-15 MED ORDER — VENLAFAXINE HCL ER 75 MG PO CP24
75.0000 mg | ORAL_CAPSULE | Freq: Every day | ORAL | Status: DC
  Administered 2016-05-16: 75 mg via ORAL
  Filled 2016-05-15: qty 1

## 2016-05-15 MED ORDER — MIDAZOLAM HCL 2 MG/2ML IJ SOLN
INTRAMUSCULAR | Status: AC
  Filled 2016-05-15: qty 2

## 2016-05-15 MED ORDER — CEFAZOLIN SODIUM-DEXTROSE 2-4 GM/100ML-% IV SOLN
2.0000 g | INTRAVENOUS | Status: AC
  Administered 2016-05-15: 2 g via INTRAVENOUS
  Filled 2016-05-15: qty 100

## 2016-05-15 MED ORDER — HYDROMORPHONE HCL 1 MG/ML IJ SOLN
0.2500 mg | INTRAMUSCULAR | Status: DC | PRN
  Administered 2016-05-15 (×3): 0.5 mg via INTRAVENOUS

## 2016-05-15 MED ORDER — DEXAMETHASONE SODIUM PHOSPHATE 10 MG/ML IJ SOLN
INTRAMUSCULAR | Status: DC | PRN
  Administered 2016-05-15: 10 mg via INTRAVENOUS

## 2016-05-15 MED ORDER — HYDROCODONE-ACETAMINOPHEN 5-325 MG PO TABS
1.0000 | ORAL_TABLET | ORAL | Status: DC | PRN
  Administered 2016-05-16 (×2): 2 via ORAL
  Filled 2016-05-15 (×2): qty 2

## 2016-05-15 SURGICAL SUPPLY — 46 items
ATTRACTOMAT 16X20 MAGNETIC DRP (DRAPES) ×4 IMPLANT
BLADE CLIPPER SURG (BLADE) IMPLANT
BLADE SURG 10 STRL SS (BLADE) ×4 IMPLANT
BLADE SURG 15 STRL LF DISP TIS (BLADE) ×2 IMPLANT
BLADE SURG 15 STRL SS (BLADE) ×2
CANISTER SUCT 3000ML PPV (MISCELLANEOUS) ×4 IMPLANT
CHLORAPREP W/TINT 10.5 ML (MISCELLANEOUS) ×4 IMPLANT
CLIP TI MEDIUM 24 (CLIP) ×4 IMPLANT
CLIP TI WIDE RED SMALL 24 (CLIP) ×4 IMPLANT
CLOSURE WOUND 1/2 X4 (GAUZE/BANDAGES/DRESSINGS) ×1
COVER SURGICAL LIGHT HANDLE (MISCELLANEOUS) ×4 IMPLANT
CRADLE DONUT ADULT HEAD (MISCELLANEOUS) ×4 IMPLANT
DRAPE LAPAROTOMY 100X72 PEDS (DRAPES) ×4 IMPLANT
DRAPE UTILITY XL STRL (DRAPES) ×4 IMPLANT
ELECT CAUTERY BLADE 6.4 (BLADE) ×4 IMPLANT
ELECT REM PT RETURN 9FT ADLT (ELECTROSURGICAL) ×4
ELECTRODE REM PT RTRN 9FT ADLT (ELECTROSURGICAL) ×2 IMPLANT
GAUZE SPONGE 4X4 12PLY STRL (GAUZE/BANDAGES/DRESSINGS) ×4 IMPLANT
GAUZE SPONGE 4X4 16PLY XRAY LF (GAUZE/BANDAGES/DRESSINGS) ×4 IMPLANT
GLOVE SURG ORTHO 8.0 STRL STRW (GLOVE) ×8 IMPLANT
GOWN STRL REUS W/ TWL LRG LVL3 (GOWN DISPOSABLE) ×4 IMPLANT
GOWN STRL REUS W/ TWL XL LVL3 (GOWN DISPOSABLE) ×2 IMPLANT
GOWN STRL REUS W/TWL LRG LVL3 (GOWN DISPOSABLE) ×4
GOWN STRL REUS W/TWL XL LVL3 (GOWN DISPOSABLE) ×2
HEMOSTAT SURGICEL 2X4 FIBR (HEMOSTASIS) ×4 IMPLANT
ILLUMINATOR WAVEGUIDE N/F (MISCELLANEOUS) ×4 IMPLANT
KIT BASIN OR (CUSTOM PROCEDURE TRAY) ×4 IMPLANT
KIT ROOM TURNOVER OR (KITS) ×4 IMPLANT
LIGHT WAVEGUIDE WIDE FLAT (MISCELLANEOUS) IMPLANT
NS IRRIG 1000ML POUR BTL (IV SOLUTION) ×4 IMPLANT
PACK SURGICAL SETUP 50X90 (CUSTOM PROCEDURE TRAY) ×4 IMPLANT
PAD ARMBOARD 7.5X6 YLW CONV (MISCELLANEOUS) ×4 IMPLANT
PENCIL BUTTON HOLSTER BLD 10FT (ELECTRODE) ×4 IMPLANT
SHEARS HARMONIC 9CM CVD (BLADE) ×4 IMPLANT
SPECIMEN JAR MEDIUM (MISCELLANEOUS) ×4 IMPLANT
SPONGE INTESTINAL PEANUT (DISPOSABLE) ×4 IMPLANT
STRIP CLOSURE SKIN 1/2X4 (GAUZE/BANDAGES/DRESSINGS) ×3 IMPLANT
SUT MNCRL AB 4-0 PS2 18 (SUTURE) ×4 IMPLANT
SUT SILK 2 0 (SUTURE) ×2
SUT SILK 2-0 18XBRD TIE 12 (SUTURE) ×2 IMPLANT
SUT VIC AB 3-0 SH 18 (SUTURE) ×8 IMPLANT
SYR BULB 3OZ (MISCELLANEOUS) ×4 IMPLANT
TOWEL OR 17X24 6PK STRL BLUE (TOWEL DISPOSABLE) ×4 IMPLANT
TOWEL OR 17X26 10 PK STRL BLUE (TOWEL DISPOSABLE) ×4 IMPLANT
TUBE CONNECTING 12'X1/4 (SUCTIONS) ×1
TUBE CONNECTING 12X1/4 (SUCTIONS) ×3 IMPLANT

## 2016-05-15 NOTE — Interval H&P Note (Signed)
History and Physical Interval Note:  05/15/2016 7:05 AM  Nicole Lozano  has presented today for surgery, with the diagnosis of GRAVES' DISEASE. The various methods of treatment have been discussed with the patient and family. After consideration of risks, benefits and other options for treatment, the patient has consented to    Procedure(s): TOTAL THYROIDECTOMY (N/A) as a surgical intervention .    The patient's history has been reviewed, patient examined, no change in status, stable for surgery.  I have reviewed the patient's chart and labs.  Questions were answered to the patient's satisfaction.    Velora Hecklerodd M. Miki Blank, MD, FACS General & Endocrine Surgery [facility] Office: [phone]  [name] MJudie Petit

## 2016-05-15 NOTE — Anesthesia Postprocedure Evaluation (Signed)
Anesthesia Post Note  Patient: Nicole Lozano  Procedure(s) Performed: Procedure(s) (LRB): TOTAL THYROIDECTOMY (N/A) EXCISION OF SKIN TAG (Right)  Patient location during evaluation: PACU Anesthesia Type: General Level of consciousness: awake and alert Pain management: pain level controlled Vital Signs Assessment: post-procedure vital signs reviewed and stable Respiratory status: spontaneous breathing, nonlabored ventilation, respiratory function stable and patient connected to nasal cannula oxygen Cardiovascular status: blood pressure returned to baseline and stable Postop Assessment: no signs of nausea or vomiting Anesthetic complications: no       Last Vitals:  Vitals:   05/15/16 1030 05/15/16 1045  BP: 136/88   Pulse: 92   Resp: 18   Temp:  36.7 C    Last Pain:  Vitals:   05/15/16 1045  TempSrc:   PainSc: 2                  Jodie Leiner,W. EDMOND

## 2016-05-15 NOTE — Anesthesia Procedure Notes (Signed)
Procedure Name: Intubation Date/Time: 05/15/2016 7:38 AM Performed by: Suzy Bouchard Pre-anesthesia Checklist: Patient identified, Emergency Drugs available, Suction available, Patient being monitored and Timeout performed Patient Re-evaluated:Patient Re-evaluated prior to inductionOxygen Delivery Method: Circle system utilized Preoxygenation: Pre-oxygenation with 100% oxygen Intubation Type: IV induction Ventilation: Mask ventilation without difficulty Laryngoscope Size: Miller and 2 Grade View: Grade I Tube type: Oral Tube size: 7.0 mm Number of attempts: 1 Airway Equipment and Method: LTA kit utilized and Stylet Placement Confirmation: ETT inserted through vocal cords under direct vision,  positive ETCO2 and breath sounds checked- equal and bilateral Secured at: 22 cm Tube secured with: Tape Dental Injury: Teeth and Oropharynx as per pre-operative assessment

## 2016-05-15 NOTE — Op Note (Signed)
Procedure Note  Pre-operative Diagnosis:  Hyperthyroidism  Post-operative Diagnosis:  same  Surgeon:  Velora Hecklerodd M. Donice Alperin, MD, FACS  Assistant:  Myrtie SomanSharon Hitchcock, RNFA   Procedure:  Total thyroidectomy  Anesthesia:  General  Estimated Blood Loss:  minimal  Drains: none         Specimen: thyroid to pathology  Indications:  The patient is a 41 year old female who presents with Graves disease.  Patient is referred by Dr. Romero BellingSean Ellison for evaluation and surgical management of Graves' disease. Patient was diagnosed in October 2017. She had developed anxiety and tachycardia. She was placed on beta blockade and anti-thyroid medications. She had an allergy to Tapazole and was switched to propylthiouracil. She is taking metoprolol 50 mg daily. Patient had considered radioactive iodine treatment. However she was noted to have a nodular right lobe of the thyroid which was mildly enlarged. Patient wished to seek surgical consultation to discuss options for treatment. Patient has had no prior thyroid problems until last year. She had never been on thyroid medication. She has had no prior head or neck surgery. There is no immediate family history of thyroid disease or other endocrine neoplasms. Patient has had tremors which had been controlled with beta blockade.   Procedure Details: [facility]  The patient was brought to the operating room and placed in a supine position on the operating room table.  Following administration of general anesthesia, the patient was positioned and then prepped and draped in the usual aseptic fashion.  After ascertaining that an adequate level of anesthesia had been achieved, a Kocher incision was made with #15 blade.  Dissection was carried through subcutaneous tissues and platysma. Hemostasis was achieved with the electrocautery.  Skin flaps were elevated cephalad and caudad from the thyroid notch to the sternal notch.  The  Mahorner self-retaining retractor was placed for exposure.  Strap muscles were incised in the midline and dissection was begun on the left side.  Strap muscles were reflected laterally.  Left thyroid lobe was mildly enlarged and nodular.  The left lobe was gently mobilized with blunt dissection.  Superior pole vessels were dissected out and divided individually between small and medium Ligaclips with the Harmonic scalpel.  The thyroid lobe was rolled anteriorly.  Branches of the inferior thyroid artery were divided between small Ligaclips with the Harmonic scalpel.  Inferior venous tributaries were divided between Ligaclips.  Both the superior and inferior parathyroid glands were identified and preserved on their vascular pedicles.  The recurrent laryngeal nerve was identified and preserved along its course.  The ligament of Allyson SabalBerry was released with the electrocautery and the gland was mobilized onto the anterior trachea. Isthmus was mobilized across the midline.  There was a large pyramidal lobe present which was dissected off the thyroid cartilage and resected with the isthmus.  Dry pack was placed in the left neck.  Next, the right thyroid lobe was gently mobilized with blunt dissection.  Right thyroid lobe was moderately enlarged and nodular.  Superior pole vessels were dissected out and divided between small and medium Ligaclips with the Harmonic scalpel.  Superior parathyroid was identified and preserved.  Inferior venous tributaries were divided between medium Ligaclips with the Harmonic scalpel.  The right thyroid lobe was rolled anteriorly and the branches of the inferior thyroid artery divided between small Ligaclips.  The right recurrent laryngeal nerve was identified and preserved along its course.  The ligament of Allyson SabalBerry was released  with the electrocautery.  The right thyroid lobe was mobilized onto the anterior trachea and the remainder of the thyroid was dissected off the anterior trachea and the  thyroid was completely excised.  A suture was used to mark the right lobe. The entire thyroid gland was submitted to pathology for review.  The neck was irrigated with warm saline.  Fibrillar was placed throughout the operative field.  Strap muscles were reapproximated in the midline with interrupted 3-0 Vicryl sutures.  Platysma was closed with interrupted 3-0 Vicryl sutures.  Skin was closed with a running 4-0 Monocryl subcuticular suture.  Wound was washed and dried and steri-strips were applied.  Dry gauze dressing was placed.  The patient was awakened from anesthesia and brought to the recovery room.  The patient tolerated the procedure well.   Velora Heckler, MD, FACS General & Endocrine Surgery [facility] Office: [phone]

## 2016-05-15 NOTE — Care Management Note (Signed)
Case Management Note  Patient Details  Name: Nicole AblerFrida M Lozano MRN: [id number] Date of Birth: [birth year]  Subjective/Objective:                    Action/Plan:  3-6 Total thyroidectomy Expected Discharge Date:                  Expected Discharge Plan:  Home/Self Care  In-House Referral:     Discharge planning Services     Post Acute Care Choice:    Choice offered to:     DME Arranged:    DME Agency:     HH Arranged:    HH Agency:     Status of Service:     If discussed at [employer] Length of Stay Meetings, dates discussed:    Additional Comments:  Kingsley PlanWile, Racheal Mathurin Marie, RN 05/15/2016, 12:31 PM

## 2016-05-15 NOTE — Transfer of Care (Addendum)
Immediate Anesthesia Transfer of Care Note  Patient: Nicole Lozano  Procedure(s) Performed: Procedure(s): TOTAL THYROIDECTOMY (N/A) EXCISION OF SKIN TAG (Right)  Patient Location: PACU  Anesthesia Type:General  Level of Consciousness: awake, alert  and oriented  Airway & Oxygen Therapy: Patient Spontanous Breathing and Patient connected to nasal cannula oxygen  Post-op Assessment: Report given to RN, Post -op Vital signs reviewed and stable and Patient moving all extremities X 4  Post vital signs: Reviewed and stable  Last Vitals:  Vitals:   05/15/16 0628  BP: 125/75  Pulse: 92  Resp: 18  Temp: 36.9 C    Last Pain:  Vitals:   05/15/16 0628  TempSrc: Oral  PainSc:       Patients Stated Pain Goal: 6 (05/15/16 [id number])  Complications: No apparent anesthesia complications.  Patient c/o tingling in left hand and bilateral feet.  Good grip strength bilaterally and moves feet well.  (1000  Drs Gerrit FriendsGerkin and Sampson GoonFitzgerald aware)

## 2016-05-16 ENCOUNTER — Encounter (HOSPITAL_COMMUNITY): Payer: Self-pay | Admitting: Surgery

## 2016-05-16 DIAGNOSIS — E0500 Thyrotoxicosis with diffuse goiter without thyrotoxic crisis or storm: Secondary | ICD-10-CM | POA: Diagnosis not present

## 2016-05-16 LAB — BASIC METABOLIC PANEL
Anion gap: 9 (ref 5–15)
CO2: 28 mmol/L (ref 22–32)
CREATININE: 0.5 mg/dL (ref 0.44–1.00)
Calcium: 9.3 mg/dL (ref 8.9–10.3)
Chloride: 99 mmol/L — ABNORMAL LOW (ref 101–111)
GFR calc Af Amer: >60 mL/min (ref >60)
GFR calc non Af Amer: >60 mL/min (ref >60)
GLUCOSE: 101 mg/dL — AB (ref 65–99)
Potassium: 4 mmol/L (ref 3.5–5.1)
Sodium: 136 mmol/L (ref 135–145)

## 2016-05-16 MED ORDER — METOPROLOL SUCCINATE ER 25 MG PO TB24: 25.0000 mg | ORAL_TABLET | Freq: Every day | ORAL | 0 refills | Status: AC

## 2016-05-16 MED ORDER — HYDROCODONE-ACETAMINOPHEN 5-325 MG PO TABS: 1.0000 | ORAL_TABLET | ORAL | 0 refills | Status: DC | PRN

## 2016-05-16 MED ORDER — CALCIUM CARBONATE ANTACID 500 MG PO CHEW: 2.0000 | CHEWABLE_TABLET | Freq: Two times a day (BID) | ORAL | 1 refills | Status: DC

## 2016-05-16 MED FILL — HYDROCODON-APAP 5-325: 5-325 | 2 days supply | Qty: 20 | Fill #0

## 2016-05-16 MED FILL — METOPROLOL SUCC ER 25 MG TA: 25 | 20 days supply | Qty: 20 | Fill #0

## 2016-05-16 NOTE — Discharge Summary (Signed)
Physician Discharge Summary [facility]  Patient ID: Nicole Lozano MRN: [id number] DOB/AGE: 07/09/1975 41 y.o.  Admit date: 05/15/2016 Discharge date: 05/16/2016  Admission Diagnoses:  hyperthyroidism  Discharge Diagnoses:  Principal Problem:   Hyperthyroidism   Discharged Condition: good  Hospital Course: Patient was admitted for observation following thyroid surgery.  Post op course was uncomplicated.  Pain was well controlled.  Tolerated diet.  Post op calcium level on morning following surgery was 9.3 mg/dl.  Patient was prepared for discharge home on POD#1.  Consults: None  Treatments: surgery: total thyroidectomy  Discharge Exam: Blood pressure 122/83, pulse 82, temperature 98.4 F (36.9 C), temperature source Oral, resp. rate 18, last menstrual period 05/14/2016, SpO2 98 %. HEENT - clear Neck - wound dry and intact; minimal STS; mild hoarseness Chest - clear bilaterally Cor - RRR  Disposition: Home  Discharge Instructions    Apply dressing    Complete by:  As directed    Apply light gauze dressing to wound before discharge home today.   Diet - low sodium heart healthy    Complete by:  As directed    Discharge instructions    Complete by:  As directed    [facility]  THYROID & PARATHYROID SURGERY:  POST-OP INSTRUCTIONS  Always review your discharge instruction sheet from the facility where your surgery was performed.  A prescription for pain medication may be given to you upon discharge.  Take your pain medication as prescribed.  If narcotic pain medicine is not needed, then you may take acetaminophen (Tylenol) or ibuprofen (Advil) as needed.  Take your usually prescribed medications unless otherwise directed.  If you need a refill on your pain medication, please contact our office during regular business hours.  Prescriptions will not be processed by our office after 5 pm or on weekends.  Start with a light diet upon  arrival home, such as soup and crackers or toast.  Be sure to drink plenty of fluids daily.  Resume your normal diet the day after surgery.  Most patients will experience some swelling and bruising on the chest and neck area.  Ice packs will help.  Swelling and bruising can take several days to resolve.   It is common to experience some constipation after surgery.  Increasing fluid intake and taking a stool softener (Colace) will usually help or prevent this problem.  A mild laxative (Milk of Magnesia or Miralax) should be taken according to package directions if there has been no bowel movement after 48 hours.  You have steri-strips and a gauze dressing over your incision.  You may remove the gauze bandage on the second day after surgery, and you may shower at that time.  Leave your steri-strips (small skin tapes) in place directly over the incision.  These strips should remain on the skin for 5-7 days and then be removed.  You may get them wet in the shower and pat them dry.  You may resume regular (light) daily activities beginning the next day - such as daily self-care, walking, climbing stairs - gradually increasing activities as tolerated.  You may have sexual intercourse when it is comfortable.  Refrain from any heavy lifting or straining until approved by your doctor.  You may drive when you no longer are taking prescription pain medication, you can comfortably wear a seatbelt, and you can safely maneuver your car and apply brakes.  You should see your doctor in the office for a follow-up appointment  approximately three weeks after your surgery.  Make sure that you call for this appointment within a day or two after you arrive home to insure a convenient appointment time.  WHEN TO CALL YOUR DOCTOR: -- Fever greater than 101.5 -- Inability to urinate -- Nausea and/or vomiting - persistent -- Extreme swelling or bruising -- Continued bleeding from incision -- Increased pain, redness, or  drainage from the incision -- Difficulty swallowing or breathing -- Muscle cramping or spasms -- Numbness or tingling in hands or around lips  The clinic staff is available to answer your questions during regular business hours.  Please don't hesitate to call and ask to speak to one of the nurses if you have concerns.  Velora Heckler, MD, FACS General & Endocrine Surgery [facility] Office: [phone]  Website: [email or web address]   Increase activity slowly    Complete by:  As directed    Remove dressing in 24 hours    Complete by:  As directed      Allergies as of 05/16/2016      Reactions   Methimazole Itching   Tramadol Other (See Comments)   "made me feel disconnected from the reality"      Medication List    TAKE these medications   calcium carbonate 500 MG chewable tablet Commonly known as:  TUMS Chew 2 tablets (400 mg of elemental calcium total) by mouth 2 (two) times daily.   HYDROcodone-acetaminophen 5-325 MG tablet Commonly known as:  NORCO/VICODIN Take 1-2 tablets by mouth every 4 (four) hours as needed for moderate pain.   MAGNESIUM PO Take 1 tablet by mouth daily.   metoprolol succinate 25 MG 24 hr tablet Commonly known as:  TOPROL-XL Take 1 tablet (25 mg total) by mouth daily. What changed:  medication strength  how much to take   pregabalin 50 MG capsule Commonly known as:  LYRICA Take 1 capsule (50 mg total) by mouth 2 (two) times daily.   PROBIOTIC PO Take 1 tablet by mouth daily.   venlafaxine XR 37.5 MG 24 hr capsule Commonly known as:  EFFEXOR-XR Take 1 capsule (37.5 mg total) by mouth daily. Takes 75 mg and 37.5 mg daily to equal 112.5 mg   venlafaxine XR 75 MG 24 hr capsule Commonly known as:  EFFEXOR-XR Take 1 capsule (75 mg total) by mouth daily with breakfast. Takes 75 mg and 37.5 mg daily to equal 112.5 mg      Follow-up Information    Gene Glazebrook M, MD. Schedule an appointment as soon as  possible for a visit in 3 week(s).   Specialty:  General Surgery Contact information: [address] [place] [address] [phone]        Romero Belling, MD. Schedule an appointment as soon as possible for a visit in 3 week(s).   Specialty:  Endocrinology Contact information: [address] [place] [address] [phone]           Velora Heckler, MD, [facility] Office: [phone]   Signed: Velora Heckler 05/16/2016, 8:00 AM

## 2016-05-16 NOTE — Progress Notes (Signed)
Nicole AblerFrida M Lozano to be D/C'd  per MD order. Discussed with the patient and all questions fully answered.  VSS, Skin clean, dry and intact without evidence of skin break down, no evidence of skin tears noted.  IV catheter discontinued intact. Site without signs and symptoms of complications. Dressing and pressure applied.  An After Visit Summary was printed and given to the patient. Patient received prescription.  D/c education completed with patient/family including follow up instructions, medication list, d/c activities limitations if indicated, with other d/c instructions as indicated by MD - patient able to verbalize understanding, all questions fully answered.   Patient instructed to return to ED, call 911, or call MD for any changes in condition.   Patient to be escorted via WC, and D/C home via private auto.

## 2016-05-17 NOTE — Progress Notes (Signed)
Please contact patient and notify of benign pathology results.  Harveer Sadler M. Tailynn Armetta, MD, FACS [facility] Office: [phone]   

## 2016-05-19 ENCOUNTER — Encounter: Payer: Self-pay | Admitting: Physician Assistant

## 2016-05-21 ENCOUNTER — Ambulatory Visit: Payer: BLUE CROSS/BLUE SHIELD | Admitting: Endocrinology

## 2016-05-21 ENCOUNTER — Encounter: Payer: Self-pay | Admitting: Endocrinology

## 2016-05-21 ENCOUNTER — Other Ambulatory Visit: Payer: Self-pay | Admitting: Endocrinology

## 2016-05-21 MED ORDER — LEVOTHYROXINE SODIUM 112 MCG PO TABS: 112.0000 ug | ORAL_TABLET | Freq: Every day | ORAL | 0 refills | Status: DC

## 2016-05-21 MED FILL — LEVOTHYROXINE 112 MCG TAB: 112 | 30 days supply | Qty: 30 | Fill #0

## 2016-05-22 MED ORDER — VENLAFAXINE HCL ER 75 MG PO CP24: 75.0000 mg | ORAL_CAPSULE | Freq: Every day | ORAL | 1 refills | Status: DC

## 2016-05-22 MED ORDER — VENLAFAXINE HCL ER 37.5 MG PO CP24: 37.5000 mg | ORAL_CAPSULE | Freq: Every day | ORAL | 1 refills | Status: AC

## 2016-05-22 NOTE — Addendum Note (Signed)
Addended by: Marcelline MatesMARTIN, Nahom Carfagno on: 05/22/2016 08:06 PM   Modules accepted: Orders

## 2016-05-23 MED FILL — VENLAFAXINE HCL ER 75 MG CA: 75 | 30 days supply | Qty: 30 | Fill #0

## 2016-05-23 MED FILL — VENLAFAXINE HCL ER 37.5 MG: 37.5 | 30 days supply | Qty: 30 | Fill #0

## 2016-05-31 ENCOUNTER — Telehealth: Payer: Self-pay | Admitting: Endocrinology

## 2016-05-31 NOTE — Telephone Encounter (Signed)
Ov now or tomorrow--your choice

## 2016-05-31 NOTE — Telephone Encounter (Signed)
Patient scheduled for tomorrow at 11 am.  

## 2016-05-31 NOTE — Telephone Encounter (Signed)
Pt had surgery 05/15/16, she said that now she is having a strange symptom, she described it as a weird, muscle tension. It either cramps or tightens up and won't go away for about 4-5 minutes. She is concerned about this and would like a call back to discuss.

## 2016-05-31 NOTE — Telephone Encounter (Signed)
See message and please advise, Thanks!  

## 2016-06-01 ENCOUNTER — Ambulatory Visit (INDEPENDENT_AMBULATORY_CARE_PROVIDER_SITE_OTHER): Payer: BLUE CROSS/BLUE SHIELD | Admitting: Endocrinology

## 2016-06-01 DIAGNOSIS — E039 Hypothyroidism, unspecified: Secondary | ICD-10-CM | POA: Insufficient documentation

## 2016-06-01 DIAGNOSIS — M791 Myalgia, unspecified site: Secondary | ICD-10-CM

## 2016-06-01 DIAGNOSIS — E890 Postprocedural hypothyroidism: Secondary | ICD-10-CM

## 2016-06-01 DIAGNOSIS — R252 Cramp and spasm: Secondary | ICD-10-CM | POA: Insufficient documentation

## 2016-06-01 LAB — PHOSPHORUS: PHOSPHORUS: 2.5 mg/dL (ref 2.3–4.6)

## 2016-06-01 LAB — BASIC METABOLIC PANEL
BUN: 8 mg/dL (ref 6–23)
CHLORIDE: 97 meq/L (ref 96–112)
CO2: 26 meq/L (ref 19–32)
CREATININE: 0.69 mg/dL (ref 0.40–1.20)
Calcium: 10.5 mg/dL (ref 8.4–10.5)
GFR: 101.02 mL/min (ref >60.00)
Glucose, Bld: 83 mg/dL (ref 70–99)
POTASSIUM: 4.3 meq/L (ref 3.5–5.1)
Sodium: 136 mEq/L (ref 135–145)

## 2016-06-01 LAB — VITAMIN D 25 HYDROXY (VIT D DEFICIENCY, FRACTURES): VITD: 36.86 ng/mL (ref 30.00–100.00)

## 2016-06-01 LAB — SEDIMENTATION RATE: Sed Rate: 17 mm/hr (ref 0–20)

## 2016-06-01 LAB — T3, FREE: T3, Free: 2.5 pg/mL (ref 2.3–4.2)

## 2016-06-01 LAB — MAGNESIUM: MAGNESIUM: 2.1 mg/dL (ref 1.5–2.5)

## 2016-06-01 LAB — T4, FREE: Free T4: 0.87 ng/dL (ref 0.60–1.60)

## 2016-06-01 LAB — TSH: TSH: 0.01 u[IU]/mL — ABNORMAL LOW (ref 0.35–4.50)

## 2016-06-01 LAB — CK: Total CK: 224 U/L — ABNORMAL HIGH (ref 7–177)
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
## 2016-06-01 NOTE — Patient Instructions (Addendum)
blood tests are requested for you today.  We'll let you know about the results.   Please come back for a follow-up appointment in 6-8 weeks.

## 2016-06-04 ENCOUNTER — Other Ambulatory Visit: Payer: Self-pay | Admitting: Endocrinology

## 2016-06-04 ENCOUNTER — Encounter: Payer: Self-pay | Admitting: Endocrinology

## 2016-06-04 ENCOUNTER — Ambulatory Visit: Payer: BLUE CROSS/BLUE SHIELD | Admitting: Endocrinology

## 2016-06-04 LAB — PTH, INTACT AND CALCIUM
Calcium: 10.8 mg/dL — ABNORMAL HIGH (ref 8.6–10.2)
PTH: 15 pg/mL (ref 14–64)

## 2016-06-04 MED ORDER — LEVOTHYROXINE SODIUM 112 MCG PO TABS: 112.0000 ug | ORAL_TABLET | Freq: Every day | ORAL | 3 refills | Status: DC

## 2016-06-05 ENCOUNTER — Other Ambulatory Visit: Payer: Self-pay | Admitting: Endocrinology

## 2016-06-05 DIAGNOSIS — E890 Postprocedural hypothyroidism: Secondary | ICD-10-CM

## 2016-06-18 ENCOUNTER — Other Ambulatory Visit (INDEPENDENT_AMBULATORY_CARE_PROVIDER_SITE_OTHER): Payer: BLUE CROSS/BLUE SHIELD

## 2016-06-18 ENCOUNTER — Other Ambulatory Visit: Payer: Self-pay | Admitting: Endocrinology

## 2016-06-18 ENCOUNTER — Encounter: Payer: Self-pay | Admitting: Endocrinology

## 2016-06-18 DIAGNOSIS — E890 Postprocedural hypothyroidism: Secondary | ICD-10-CM

## 2016-06-18 DIAGNOSIS — M791 Myalgia, unspecified site: Secondary | ICD-10-CM

## 2016-06-18 LAB — T3, FREE: T3, Free: 2.9 pg/mL (ref 2.3–4.2)

## 2016-06-18 LAB — CK: Total CK: 286 U/L — ABNORMAL HIGH (ref 7–177)

## 2016-06-18 LAB — TSH

## 2016-06-18 LAB — T4, FREE: Free T4: 0.76 ng/dL (ref 0.60–1.60)

## 2016-06-18 MED ORDER — LEVOTHYROXINE SODIUM 50 MCG PO TABS: 50.0000 ug | ORAL_TABLET | Freq: Every day | ORAL | 5 refills | Status: DC

## 2016-06-19 ENCOUNTER — Telehealth: Payer: Self-pay | Admitting: Endocrinology

## 2016-06-19 MED FILL — VENLAFAXINE HCL ER 75 MG CA: 75 | 30 days supply | Qty: 30 | Fill #1

## 2016-06-19 MED FILL — METOPROLOL SUCC ER 25 MG TA: 25 | 30 days supply | Qty: 30 | Fill #1

## 2016-06-19 MED FILL — LEVOTHYROXINE 50 MCG TABLET: 50 | 30 days supply | Qty: 30 | Fill #0

## 2016-06-19 MED FILL — VENLAFAXINE HCL ER 37.5 MG: 37.5 | 30 days supply | Qty: 30 | Fill #1

## 2016-06-19 NOTE — Telephone Encounter (Signed)
Please review the MyChart message from today. Thanks!

## 2016-06-19 NOTE — Telephone Encounter (Signed)
Pt called in and said that she needs to speak with someone today, she has some questions about her medication.

## 2016-06-20 NOTE — Telephone Encounter (Signed)
Patient is calling back with question her medication.and levels. Please advise

## 2016-06-20 NOTE — Telephone Encounter (Signed)
Patient called into report she had submitted a My chart message and would like for you to review. She also wanted me to inform you she is out of her levothyroxine medication,

## 2016-06-23 ENCOUNTER — Telehealth: Payer: Self-pay | Admitting: Endocrinology

## 2016-06-23 NOTE — Telephone Encounter (Signed)
-----   Message from Stevan Born sent at 06/22/2016 12:35 PM EDT ----- Regarding: RE: switching doctors form [facility] to [facility]   ----- Message ----- From: Carlus Pavlov, MD Sent: 06/22/2016  10:02 AM To: Romero Belling, MD, Stevan Born Subject: RE: switching doctors form [facility] to [place]  I am going to decline for now due to lack of appt slots in the next 3 mo. Maybe Dr. Lucianne Muss can help. C ----- Message ----- From: Stevan Born Sent: 06/21/2016   4:38 PM To: Romero Belling, MD, Carlus Pavlov, MD Subject: switching doctors form [facility] to [facility]      Patient asking if she could switch doctors form [facility] to [place]. Please advise

## 2016-06-25 ENCOUNTER — Encounter: Payer: Self-pay | Admitting: Physician Assistant

## 2016-06-25 DIAGNOSIS — E890 Postprocedural hypothyroidism: Secondary | ICD-10-CM

## 2016-06-25 NOTE — Telephone Encounter (Signed)
Pt has been made aware of the concerns with switching to Dr. Elvera Lennox. Dr. [facility] is fine with the pt switching to another MD.  Dr. Lucianne Muss was offered, pt is going to think about it and call us back

## 2016-06-25 NOTE — Telephone Encounter (Signed)
-----   Message from Patricia Bennett sent at 06/22/2016 12:35 PM EDT ----- Regarding: RE: switching doctors form [facility] to [facility]   ----- Message ----- From: Cristina G[facility], MD Sent: 06/22/2016  10:02 AM To: Sean [facility], MD, Patricia Bennett Subject: RE: switching doctors form [facility] to [facility]  I am going to decline for now due to lack of appt slots in the next 3 mo. Maybe Dr. Kumar can help. C ----- Message ----- From: Patricia Bennett Sent: 06/21/2016   4:38 PM To: Sean [facility], MD, Cristina Gherghe, MD Subject: switching doctors form [facility] to [facility]      Patient asking if she could switch doctors form [facility] to [facility]. Please advise  

## 2016-07-01 NOTE — Addendum Note (Signed)
Addended by: Waldon Merl on: 07/01/2016 10:55 AM   Modules accepted: Orders

## 2016-07-03 ENCOUNTER — Encounter: Payer: Self-pay | Admitting: Physician Assistant

## 2016-07-03 ENCOUNTER — Ambulatory Visit (INDEPENDENT_AMBULATORY_CARE_PROVIDER_SITE_OTHER): Payer: BLUE CROSS/BLUE SHIELD | Admitting: Physician Assistant

## 2016-07-03 VITALS — BP 102/80 | HR 83 | Temp 98.7°F | Resp 14 | Ht 62.0 in | Wt 162.0 lb

## 2016-07-03 DIAGNOSIS — E890 Postprocedural hypothyroidism: Secondary | ICD-10-CM

## 2016-07-03 DIAGNOSIS — M62838 Other muscle spasm: Secondary | ICD-10-CM | POA: Diagnosis not present

## 2016-07-03 LAB — COMPREHENSIVE METABOLIC PANEL
ALBUMIN: 4.6 g/dL (ref 3.5–5.2)
ALK PHOS: 67 U/L (ref 39–117)
ALT: 32 U/L (ref 0–35)
AST: 23 U/L (ref 0–37)
BILIRUBIN TOTAL: 0.5 mg/dL (ref 0.2–1.2)
BUN: 9 mg/dL (ref 6–23)
CO2: 29 mEq/L (ref 19–32)
Calcium: 10.4 mg/dL (ref 8.4–10.5)
Chloride: 100 mEq/L (ref 96–112)
Creatinine, Ser: 0.71 mg/dL (ref 0.40–1.20)
GFR: 97.7 mL/min (ref >60.00)
GLUCOSE: 92 mg/dL (ref 70–99)
Potassium: 4.9 mEq/L (ref 3.5–5.1)
Sodium: 139 mEq/L (ref 135–145)
TOTAL PROTEIN: 7.3 g/dL (ref 6.0–8.3)

## 2016-07-03 LAB — CK: Total CK: 294 U/L — ABNORMAL HIGH (ref 7–177)

## 2016-07-03 LAB — TSH: TSH: 0.02 u[IU]/mL — ABNORMAL LOW (ref 0.35–4.50)

## 2016-07-03 LAB — MAGNESIUM: MAGNESIUM: 1.9 mg/dL (ref 1.5–2.5)

## 2016-07-03 LAB — T3, FREE: T3 FREE: 2.7 pg/mL (ref 2.3–4.2)

## 2016-07-03 LAB — T4, FREE: Free T4: 0.94 ng/dL (ref 0.60–1.60)
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
## 2016-07-03 NOTE — Patient Instructions (Signed)
Please go to the lab for blood work. I will call you with your results.  We will alter regimen accordingly. I am working on getting you set up with Dr. Sharl Ma with Endocrinology.  Please continue medications as directed for now.

## 2016-07-03 NOTE — Progress Notes (Signed)
Pre visit review using our clinic review tool, if applicable. No additional management support is needed unless otherwise documented below in the visit note. 

## 2016-07-04 ENCOUNTER — Other Ambulatory Visit: Payer: Self-pay | Admitting: Physician Assistant

## 2016-07-04 LAB — T3: T3 TOTAL: 71 ng/dL — AB (ref 76–181)

## 2016-07-04 MED ORDER — LEVOTHYROXINE SODIUM 100 MCG PO TABS: 100.0000 ug | ORAL_TABLET | Freq: Every day | ORAL | 3 refills | Status: AC

## 2016-07-11 ENCOUNTER — Encounter: Payer: Self-pay | Admitting: Physician Assistant

## 2016-07-12 ENCOUNTER — Ambulatory Visit: Payer: BLUE CROSS/BLUE SHIELD | Admitting: Endocrinology

## 2016-07-13 MED ORDER — HYDROCODONE-ACETAMINOPHEN 10-325 MG PO TABS: 1.0000 | ORAL_TABLET | Freq: Four times a day (QID) | ORAL | 0 refills | Status: DC | PRN

## 2016-07-17 ENCOUNTER — Encounter: Payer: Self-pay | Admitting: Physician Assistant

## 2016-07-17 ENCOUNTER — Ambulatory Visit (INDEPENDENT_AMBULATORY_CARE_PROVIDER_SITE_OTHER): Payer: BLUE CROSS/BLUE SHIELD | Admitting: Physician Assistant

## 2016-07-17 VITALS — BP 102/78 | HR 78 | Temp 98.8°F | Resp 14 | Ht 62.0 in | Wt 159.0 lb

## 2016-07-17 DIAGNOSIS — M5090 Cervical disc disorder, unspecified, unspecified cervical region: Secondary | ICD-10-CM | POA: Diagnosis not present

## 2016-07-17 NOTE — Patient Instructions (Signed)
Please take medication as directed when needed for pain. Would consider getting back in with specialist to discuss injections, etc. Give this a thought and give me a call.  Call me in a week to let me know how the medication is doing for you.   We will consider adding on a daily medication to help with chronic pain. Our main options would be the Lyrica or Gabapentin as most other options interact with current medications.  Give this some thought. I will look into other options as well.
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
## 2016-07-17 NOTE — Progress Notes (Signed)
Pre visit review using our clinic review tool, if applicable. No additional management support is needed unless otherwise documented below in the visit note. 

## 2016-07-19 ENCOUNTER — Encounter: Payer: Self-pay | Admitting: Physician Assistant

## 2016-07-22 DIAGNOSIS — M5090 Cervical disc disorder, unspecified, unspecified cervical region: Secondary | ICD-10-CM | POA: Insufficient documentation

## 2016-07-22 NOTE — Assessment & Plan Note (Signed)
Will have her use the hydrocodone as directed for severe pain. Supportive measures, exercises and OTC pain relievers reviewed. CSC renewed. UDS up-to-date. Patient encouraged to limit use of the Rx pain medication. Continue Effexor at current strength. She is to establish care with a new specialist when she moves back to [place] in the next couple of months.

## 2016-07-26 MED FILL — VENLAFAXINE HCL ER 37.5 MG: 37.5 | 30 days supply | Qty: 30 | Fill #2 | Status: TO

## 2016-08-07 ENCOUNTER — Encounter: Payer: Self-pay | Admitting: Physician Assistant

## 2016-08-16 MED FILL — VENLAFAXINE HCL ER 75 MG CA: 75 | 30 days supply | Qty: 30 | Fill #2

## 2016-08-21 ENCOUNTER — Encounter: Payer: Self-pay | Admitting: Physician Assistant

## 2016-08-22 MED ORDER — HYDROCODONE-ACETAMINOPHEN 10-325 MG PO TABS: 1.0000 | ORAL_TABLET | Freq: Four times a day (QID) | ORAL | 0 refills | Status: DC | PRN

## 2016-08-22 NOTE — Telephone Encounter (Signed)
Pt called and I advise that Rx was ready for p/u at our office.

## 2016-08-22 NOTE — Telephone Encounter (Signed)
Indication for chronic opioid: Chronic cervical neck pain Medication and dose: Hydrocodone 10-325 mg # pills per month: 20 Last UDS date: Due at pickup. Pain contract signed (Y/N): Y Date narcotic database last reviewed (include red flags): 08/22/2016. No red flags. Copy of report to be scanned into EMR.  Refill granted.

## 2016-09-03 ENCOUNTER — Encounter: Payer: Self-pay | Admitting: Physician Assistant

## 2016-09-03 DIAGNOSIS — M5090 Cervical disc disorder, unspecified, unspecified cervical region: Secondary | ICD-10-CM

## 2016-09-06 ENCOUNTER — Encounter: Payer: Self-pay | Admitting: Physician Assistant

## 2016-09-13 MED FILL — VENLAFAXINE HCL ER 75 MG CA: 75 | 30 days supply | Qty: 30 | Fill #3 | Status: TO

## 2016-09-16 ENCOUNTER — Encounter: Payer: Self-pay | Admitting: Physician Assistant

## 2016-09-17 ENCOUNTER — Encounter: Payer: Self-pay | Admitting: Physician Assistant

## 2016-09-17 MED ORDER — HYDROCODONE-ACETAMINOPHEN 10-325 MG PO TABS: 1.0000 | ORAL_TABLET | Freq: Four times a day (QID) | ORAL | 0 refills | Status: AC | PRN

## 2016-09-17 MED FILL — HYDROCODON-APAP 10-325: 10-325 | 5 days supply | Qty: 20 | Fill #0

## 2016-09-25 ENCOUNTER — Encounter: Payer: Self-pay | Admitting: Physician Assistant

## 2016-09-26 MED ORDER — VENLAFAXINE HCL ER 75 MG PO CP24: 75.0000 mg | ORAL_CAPSULE | Freq: Every day | ORAL | 2 refills | Status: AC

## 2016-11-08 ENCOUNTER — Encounter: Payer: Self-pay | Admitting: Physician Assistant

## 2017-02-22 ENCOUNTER — Encounter: Payer: Self-pay | Admitting: Physician Assistant

## 2017-08-25 IMAGING — NM NM THYROID IMAGING W/ UPTAKE SINGLE (24 HR)
4 series · 4 of 4 positions shown · non-contrast
Comparison: None

CLINICAL DATA: Neck swelling.  Lump and neck.

EXAM:
THYROID SCAN AND UPTAKE - 24 HOURS
TECHNIQUE: Following the per oral administration of 6-N3N sodium iodide, the
patient returned at 24 hours and uptake measurements were acquired
with the uptake probe centered on the neck. Thyroid imaging was
performed following the intravenous administration of the Xc-66m
Pertechnetate.
RADIOPHARMACEUTICALS:  10.23 MicroCuries 6-N3N sodium iodide orally
and 10 mCi Lechnetium-22m pertechnetate IV

[th thyroid scan · 1.15mm/px · 1 of 1 slices shown (1 of 4)]
[im 1/1]
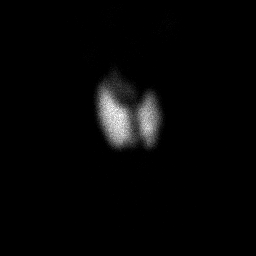

[th thyroid scan · 1.14mm/px · 1 of 1 slices shown (2 of 4)]
[im 1/1]
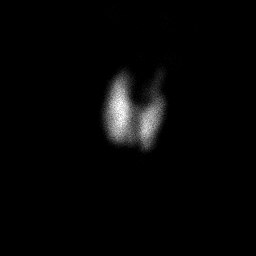

[th thyroid scan · 1.14mm/px · 1 of 1 slices shown (3 of 4)]
[im 1/1]
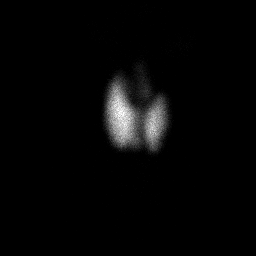

[th thyroid scan · 1.14mm/px · 1 of 1 slices shown (4 of 4)]
[im 1/1]
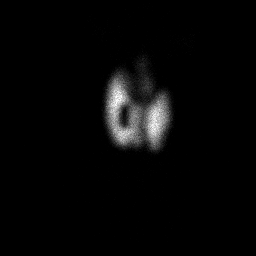

[4 of 4 positions shown; findings below may reference images not displayed]

FINDINGS: The 24 hour uptake of iodine in the thyroid is 75.7%. The normal
range is between 10 and 35%. There is homogeneous uptake throughout
both thyroid lobes and a pyramidal lobe. No nodules identified.
IMPRESSION: The findings are most consistent with Graves disease. The iodine
uptake in the thyroid is 75.7% at 24 hours. No nodules or masses are
identified.
